# Patient Record
Sex: Male | Born: 1975
Health system: Southern US, Community
[De-identification: ages and names within clinical notes are randomized; demographics above are authoritative.]

## PROBLEM LIST (undated history)

## (undated) DIAGNOSIS — G4730 Sleep apnea, unspecified: Secondary | ICD-10-CM

## (undated) DIAGNOSIS — F909 Attention-deficit hyperactivity disorder, unspecified type: Secondary | ICD-10-CM

## (undated) HISTORY — PX: WISDOM TOOTH EXTRACTION: SHX21

## (undated) HISTORY — DX: Sleep apnea, unspecified: G47.30

---

## 2013-05-04 ENCOUNTER — Other Ambulatory Visit: Payer: Self-pay | Admitting: Internal Medicine

## 2013-05-04 DIAGNOSIS — N5089 Other specified disorders of the male genital organs: Secondary | ICD-10-CM

## 2013-05-05 ENCOUNTER — Ambulatory Visit
Admission: RE | Admit: 2013-05-05 | Discharge: 2013-05-05 | Disposition: A | Discharge status: Home / Self Care | Payer: BC Managed Care – PPO | Source: Ambulatory Visit | Attending: Internal Medicine | Admitting: Internal Medicine

## 2013-05-05 DIAGNOSIS — N5089 Other specified disorders of the male genital organs: Secondary | ICD-10-CM

## 2016-03-18 DIAGNOSIS — M25461 Effusion, right knee: Secondary | ICD-10-CM | POA: Diagnosis not present

## 2016-03-18 DIAGNOSIS — M25561 Pain in right knee: Secondary | ICD-10-CM | POA: Diagnosis not present

## 2016-03-19 ENCOUNTER — Other Ambulatory Visit: Payer: Self-pay | Admitting: Orthopaedic Surgery

## 2016-03-19 DIAGNOSIS — M25561 Pain in right knee: Secondary | ICD-10-CM

## 2016-03-22 ENCOUNTER — Ambulatory Visit
Admission: RE | Admit: 2016-03-22 | Discharge: 2016-03-22 | Disposition: A | Discharge status: Home / Self Care | Payer: Self-pay | Source: Ambulatory Visit | Attending: Orthopaedic Surgery | Admitting: Orthopaedic Surgery

## 2016-03-22 DIAGNOSIS — M25561 Pain in right knee: Secondary | ICD-10-CM

## 2016-03-22 DIAGNOSIS — S83411A Sprain of medial collateral ligament of right knee, initial encounter: Secondary | ICD-10-CM | POA: Diagnosis not present

## 2016-03-22 DIAGNOSIS — S83511A Sprain of anterior cruciate ligament of right knee, initial encounter: Secondary | ICD-10-CM | POA: Diagnosis not present

## 2016-03-23 DIAGNOSIS — M25561 Pain in right knee: Secondary | ICD-10-CM | POA: Diagnosis not present

## 2016-03-23 DIAGNOSIS — M25461 Effusion, right knee: Secondary | ICD-10-CM | POA: Diagnosis not present

## 2016-03-23 DIAGNOSIS — S83511A Sprain of anterior cruciate ligament of right knee, initial encounter: Secondary | ICD-10-CM | POA: Diagnosis not present

## 2016-04-20 DIAGNOSIS — S83511D Sprain of anterior cruciate ligament of right knee, subsequent encounter: Secondary | ICD-10-CM | POA: Diagnosis not present

## 2016-04-24 DIAGNOSIS — M25561 Pain in right knee: Secondary | ICD-10-CM | POA: Diagnosis not present

## 2016-04-24 DIAGNOSIS — R531 Weakness: Secondary | ICD-10-CM | POA: Diagnosis not present

## 2016-04-24 DIAGNOSIS — S83511D Sprain of anterior cruciate ligament of right knee, subsequent encounter: Secondary | ICD-10-CM | POA: Diagnosis not present

## 2016-04-24 DIAGNOSIS — R262 Difficulty in walking, not elsewhere classified: Secondary | ICD-10-CM | POA: Diagnosis not present

## 2016-04-28 DIAGNOSIS — S83511D Sprain of anterior cruciate ligament of right knee, subsequent encounter: Secondary | ICD-10-CM | POA: Diagnosis not present

## 2016-04-28 DIAGNOSIS — M25561 Pain in right knee: Secondary | ICD-10-CM | POA: Diagnosis not present

## 2016-04-28 DIAGNOSIS — R531 Weakness: Secondary | ICD-10-CM | POA: Diagnosis not present

## 2016-04-28 DIAGNOSIS — R262 Difficulty in walking, not elsewhere classified: Secondary | ICD-10-CM | POA: Diagnosis not present

## 2016-05-06 DIAGNOSIS — R262 Difficulty in walking, not elsewhere classified: Secondary | ICD-10-CM | POA: Diagnosis not present

## 2016-05-06 DIAGNOSIS — R531 Weakness: Secondary | ICD-10-CM | POA: Diagnosis not present

## 2016-05-06 DIAGNOSIS — S83511D Sprain of anterior cruciate ligament of right knee, subsequent encounter: Secondary | ICD-10-CM | POA: Diagnosis not present

## 2016-05-06 DIAGNOSIS — M25561 Pain in right knee: Secondary | ICD-10-CM | POA: Diagnosis not present

## 2016-05-08 DIAGNOSIS — S83511D Sprain of anterior cruciate ligament of right knee, subsequent encounter: Secondary | ICD-10-CM | POA: Diagnosis not present

## 2016-05-08 DIAGNOSIS — R531 Weakness: Secondary | ICD-10-CM | POA: Diagnosis not present

## 2016-05-08 DIAGNOSIS — M25561 Pain in right knee: Secondary | ICD-10-CM | POA: Diagnosis not present

## 2016-05-08 DIAGNOSIS — R262 Difficulty in walking, not elsewhere classified: Secondary | ICD-10-CM | POA: Diagnosis not present

## 2016-05-12 DIAGNOSIS — M23611 Other spontaneous disruption of anterior cruciate ligament of right knee: Secondary | ICD-10-CM | POA: Diagnosis not present

## 2016-05-14 DIAGNOSIS — S83511D Sprain of anterior cruciate ligament of right knee, subsequent encounter: Secondary | ICD-10-CM | POA: Diagnosis not present

## 2016-05-15 DIAGNOSIS — M23611 Other spontaneous disruption of anterior cruciate ligament of right knee: Secondary | ICD-10-CM | POA: Diagnosis not present

## 2016-05-19 DIAGNOSIS — M23611 Other spontaneous disruption of anterior cruciate ligament of right knee: Secondary | ICD-10-CM | POA: Diagnosis not present

## 2016-05-22 DIAGNOSIS — M23611 Other spontaneous disruption of anterior cruciate ligament of right knee: Secondary | ICD-10-CM | POA: Diagnosis not present

## 2016-05-29 DIAGNOSIS — M23611 Other spontaneous disruption of anterior cruciate ligament of right knee: Secondary | ICD-10-CM | POA: Diagnosis not present

## 2016-06-01 DIAGNOSIS — M23611 Other spontaneous disruption of anterior cruciate ligament of right knee: Secondary | ICD-10-CM | POA: Diagnosis not present

## 2016-06-03 ENCOUNTER — Ambulatory Visit (INDEPENDENT_AMBULATORY_CARE_PROVIDER_SITE_OTHER): Payer: BLUE CROSS/BLUE SHIELD | Admitting: Orthopedic Surgery

## 2016-06-03 DIAGNOSIS — S83511D Sprain of anterior cruciate ligament of right knee, subsequent encounter: Secondary | ICD-10-CM | POA: Diagnosis not present

## 2016-06-09 DIAGNOSIS — Z125 Encounter for screening for malignant neoplasm of prostate: Secondary | ICD-10-CM | POA: Diagnosis not present

## 2016-06-09 DIAGNOSIS — Z0000 Encounter for general adult medical examination without abnormal findings: Secondary | ICD-10-CM | POA: Diagnosis not present

## 2016-06-14 DIAGNOSIS — Z23 Encounter for immunization: Secondary | ICD-10-CM | POA: Diagnosis not present

## 2016-06-19 DIAGNOSIS — Z125 Encounter for screening for malignant neoplasm of prostate: Secondary | ICD-10-CM | POA: Diagnosis not present

## 2016-06-19 DIAGNOSIS — Z0000 Encounter for general adult medical examination without abnormal findings: Secondary | ICD-10-CM | POA: Diagnosis not present

## 2016-06-19 DIAGNOSIS — E8881 Metabolic syndrome: Secondary | ICD-10-CM | POA: Diagnosis not present

## 2016-06-19 DIAGNOSIS — E668 Other obesity: Secondary | ICD-10-CM | POA: Diagnosis not present

## 2016-06-19 DIAGNOSIS — R0683 Snoring: Secondary | ICD-10-CM | POA: Diagnosis not present

## 2016-06-19 DIAGNOSIS — Z23 Encounter for immunization: Secondary | ICD-10-CM | POA: Diagnosis not present

## 2016-06-19 DIAGNOSIS — K760 Fatty (change of) liver, not elsewhere classified: Secondary | ICD-10-CM | POA: Diagnosis not present

## 2016-06-19 DIAGNOSIS — R7301 Impaired fasting glucose: Secondary | ICD-10-CM | POA: Diagnosis not present

## 2016-06-19 DIAGNOSIS — Z1389 Encounter for screening for other disorder: Secondary | ICD-10-CM | POA: Diagnosis not present

## 2016-07-14 ENCOUNTER — Encounter (INDEPENDENT_AMBULATORY_CARE_PROVIDER_SITE_OTHER): Payer: Self-pay | Admitting: Radiology

## 2016-07-14 NOTE — Progress Notes (Signed)
Patient had called about the knee sleeve, message was taken, his ph [phone], and I called him LMVM told him to stop by office and get this at any time.  Any one of us can size him and get this for him.  Also advised to call me if needed.

## 2016-08-10 DIAGNOSIS — S83511A Sprain of anterior cruciate ligament of right knee, initial encounter: Secondary | ICD-10-CM | POA: Diagnosis not present

## 2016-08-10 DIAGNOSIS — S83411A Sprain of medial collateral ligament of right knee, initial encounter: Secondary | ICD-10-CM | POA: Diagnosis not present

## 2016-09-10 ENCOUNTER — Ambulatory Visit (INDEPENDENT_AMBULATORY_CARE_PROVIDER_SITE_OTHER): Payer: BLUE CROSS/BLUE SHIELD | Admitting: Orthopedic Surgery

## 2017-01-28 DIAGNOSIS — J019 Acute sinusitis, unspecified: Secondary | ICD-10-CM | POA: Diagnosis not present

## 2017-06-12 DIAGNOSIS — Z23 Encounter for immunization: Secondary | ICD-10-CM | POA: Diagnosis not present

## 2017-06-25 DIAGNOSIS — R7301 Impaired fasting glucose: Secondary | ICD-10-CM | POA: Diagnosis not present

## 2017-06-25 DIAGNOSIS — Z0000 Encounter for general adult medical examination without abnormal findings: Secondary | ICD-10-CM | POA: Diagnosis not present

## 2017-06-25 DIAGNOSIS — Z125 Encounter for screening for malignant neoplasm of prostate: Secondary | ICD-10-CM | POA: Diagnosis not present

## 2017-07-06 DIAGNOSIS — Z0000 Encounter for general adult medical examination without abnormal findings: Secondary | ICD-10-CM | POA: Diagnosis not present

## 2017-07-06 DIAGNOSIS — E8881 Metabolic syndrome: Secondary | ICD-10-CM | POA: Diagnosis not present

## 2017-07-06 DIAGNOSIS — K760 Fatty (change of) liver, not elsewhere classified: Secondary | ICD-10-CM | POA: Diagnosis not present

## 2017-07-06 DIAGNOSIS — Z1389 Encounter for screening for other disorder: Secondary | ICD-10-CM | POA: Diagnosis not present

## 2017-07-06 DIAGNOSIS — E668 Other obesity: Secondary | ICD-10-CM | POA: Diagnosis not present

## 2017-07-06 DIAGNOSIS — R7301 Impaired fasting glucose: Secondary | ICD-10-CM | POA: Diagnosis not present

## 2018-05-26 DIAGNOSIS — Z23 Encounter for immunization: Secondary | ICD-10-CM | POA: Diagnosis not present

## 2018-09-27 DIAGNOSIS — R82998 Other abnormal findings in urine: Secondary | ICD-10-CM | POA: Diagnosis not present

## 2018-09-27 DIAGNOSIS — R7301 Impaired fasting glucose: Secondary | ICD-10-CM | POA: Diagnosis not present

## 2018-09-27 DIAGNOSIS — Z0000 Encounter for general adult medical examination without abnormal findings: Secondary | ICD-10-CM | POA: Diagnosis not present

## 2018-09-27 DIAGNOSIS — Z125 Encounter for screening for malignant neoplasm of prostate: Secondary | ICD-10-CM | POA: Diagnosis not present

## 2018-10-05 DIAGNOSIS — E8881 Metabolic syndrome: Secondary | ICD-10-CM | POA: Diagnosis not present

## 2018-10-05 DIAGNOSIS — Z0000 Encounter for general adult medical examination without abnormal findings: Secondary | ICD-10-CM | POA: Diagnosis not present

## 2018-10-05 DIAGNOSIS — M722 Plantar fascial fibromatosis: Secondary | ICD-10-CM | POA: Diagnosis not present

## 2018-10-05 DIAGNOSIS — R0683 Snoring: Secondary | ICD-10-CM | POA: Diagnosis not present

## 2018-10-05 DIAGNOSIS — K760 Fatty (change of) liver, not elsewhere classified: Secondary | ICD-10-CM | POA: Diagnosis not present

## 2018-10-05 DIAGNOSIS — Z1331 Encounter for screening for depression: Secondary | ICD-10-CM | POA: Diagnosis not present

## 2018-10-07 DIAGNOSIS — Z1212 Encounter for screening for malignant neoplasm of rectum: Secondary | ICD-10-CM | POA: Diagnosis not present

## 2018-10-25 ENCOUNTER — Ambulatory Visit (INDEPENDENT_AMBULATORY_CARE_PROVIDER_SITE_OTHER): Payer: BLUE CROSS/BLUE SHIELD | Admitting: Orthopaedic Surgery

## 2018-10-25 ENCOUNTER — Ambulatory Visit (INDEPENDENT_AMBULATORY_CARE_PROVIDER_SITE_OTHER): Payer: BLUE CROSS/BLUE SHIELD

## 2018-10-25 ENCOUNTER — Encounter (INDEPENDENT_AMBULATORY_CARE_PROVIDER_SITE_OTHER): Payer: Self-pay | Admitting: Orthopaedic Surgery

## 2018-10-25 DIAGNOSIS — M25511 Pain in right shoulder: Secondary | ICD-10-CM

## 2018-10-25 DIAGNOSIS — G8929 Other chronic pain: Secondary | ICD-10-CM

## 2018-10-25 NOTE — Progress Notes (Signed)
Musculoskeletal ultrasound: 12 MHz linear probe was used for diagnostic examination.  Long head biceps tendon is located in its groove but has some fluid within the sheath.  The tendon itself is unremarkable.  Subscapularis tendon has possible old mid substance scar but no acute tear.  Supraspinatus tendon is concerning for leading edge tear with retraction.  Infraspinatus is unremarkable.  Patient will proceed with MRI scan as discussed with Dr. Roda Shutters.

## 2018-10-25 NOTE — Progress Notes (Signed)
Office Visit Note   Patient: Jonathan Farley           Date of Birth: 1975-12-13           MRN: [id number] Visit Date: 10/25/2018              Requested by: No referring provider defined for this encounter. PCP: Patient, No Pcp Per   Assessment & Plan: Visit Diagnoses:  1. Acute pain of right shoulder     Plan: Impression is right shoulder injury suspect rotator cuff tear.  We had Dr. Prince Rome evaluate his rotator cuff with an ultrasound and it did show an anterior supraspinatus tear with retraction.  At this point given his age and dominant arm and activity level we recommend an MRI to fully evaluate the rotator cuff tear.  Patient in agreement.  I will call the patient with the results of the MRI.  Follow-Up Instructions: Return if symptoms worsen or fail to improve.   Orders:  Orders Placed This Encounter  Procedures  . XR Shoulder Right   No orders of the defined types were placed in this encounter.     Procedures: No procedures performed   Clinical Data: No additional findings.   Subjective: Chief Complaint  Patient presents with  . Right Shoulder - Pain    Jonathan Farley is a 42 year old right-hand-dominant gentleman who I know well from playing tennis together who comes in with 2-week history of right shoulder pain after he slipped and fell on outstretched hand.  He felt like he jammed the shoulder.  The pain got worse throughout the day.  Overall his pain has only slightly improved.  Otherwise he has been taking over-the-counter medicines.  He has significant difficulty with raising his arm above the shoulder level.  He is having to shrug in order to compensate.  He denies any radicular symptoms.   Review of Systems  Constitutional: Negative.   All other systems reviewed and are negative.    Objective: Vital Signs: There were no vitals taken for this visit.  Physical Exam Vitals signs and nursing note reviewed.  Constitutional:      Appearance: He is  well-developed.  Pulmonary:     Effort: Pulmonary effort is normal.  Abdominal:     Palpations: Abdomen is soft.  Skin:    General: Skin is warm.  Neurological:     Mental Status: He is alert and oriented to person, place, and time.  Psychiatric:        Behavior: Behavior normal.        Thought Content: Thought content normal.        Judgment: Judgment normal.     Ortho Exam Right shoulder exam shows full passive range of motion without significant pain.  He has trouble abducting and forward elevation without shrugging his shoulder.  He is not able to actively abduct above the level of the shoulder.  Negative drop arm.  Manual muscle testing shows weakness of supraspinatus and infraspinatus.  Negative apprehension sign. Specialty Comments:  No specialty comments available.  Imaging: Xr Shoulder Right  Result Date: 10/25/2018 No acute or structural abnormalities    PMFS History: There are no active problems to display for this patient.  History reviewed. No pertinent past medical history.  History reviewed. No pertinent family history.  History reviewed. No pertinent surgical history. Social History   Occupational History  . Not on file  Tobacco Use  . Smoking status: Not on file  Substance and Sexual Activity  .  Alcohol use: Not on file  . Drug use: Not on file  . Sexual activity: Not on file

## 2018-10-26 DIAGNOSIS — F900 Attention-deficit hyperactivity disorder, predominantly inattentive type: Secondary | ICD-10-CM | POA: Diagnosis not present

## 2018-10-26 DIAGNOSIS — Z6835 Body mass index (BMI) 35.0-35.9, adult: Secondary | ICD-10-CM | POA: Diagnosis not present

## 2018-10-31 ENCOUNTER — Ambulatory Visit
Admission: RE | Admit: 2018-10-31 | Discharge: 2018-10-31 | Disposition: A | Discharge status: Home / Self Care | Payer: BLUE CROSS/BLUE SHIELD | Source: Ambulatory Visit | Attending: Orthopaedic Surgery | Admitting: Orthopaedic Surgery

## 2018-10-31 DIAGNOSIS — M25511 Pain in right shoulder: Secondary | ICD-10-CM | POA: Diagnosis not present

## 2018-11-03 ENCOUNTER — Encounter (HOSPITAL_BASED_OUTPATIENT_CLINIC_OR_DEPARTMENT_OTHER): Payer: Self-pay | Admitting: *Deleted

## 2018-11-03 ENCOUNTER — Other Ambulatory Visit: Payer: Self-pay

## 2018-11-08 DIAGNOSIS — Z136 Encounter for screening for cardiovascular disorders: Secondary | ICD-10-CM | POA: Diagnosis not present

## 2018-11-09 ENCOUNTER — Ambulatory Visit (HOSPITAL_BASED_OUTPATIENT_CLINIC_OR_DEPARTMENT_OTHER): Payer: BLUE CROSS/BLUE SHIELD | Admitting: Certified Registered"

## 2018-11-09 ENCOUNTER — Encounter: Payer: Self-pay | Admitting: Orthopaedic Surgery

## 2018-11-09 ENCOUNTER — Encounter (HOSPITAL_BASED_OUTPATIENT_CLINIC_OR_DEPARTMENT_OTHER): Payer: Self-pay | Admitting: *Deleted

## 2018-11-09 ENCOUNTER — Ambulatory Visit (HOSPITAL_BASED_OUTPATIENT_CLINIC_OR_DEPARTMENT_OTHER)
Admission: RE | Admit: 2018-11-09 | Discharge: 2018-11-09 | Disposition: A | Discharge status: Home / Self Care | Payer: BLUE CROSS/BLUE SHIELD | Attending: Orthopaedic Surgery | Admitting: Orthopaedic Surgery

## 2018-11-09 ENCOUNTER — Encounter (HOSPITAL_BASED_OUTPATIENT_CLINIC_OR_DEPARTMENT_OTHER)
Admission: RE | Disposition: A | Discharge status: Home / Self Care | Payer: Self-pay | Source: Home / Self Care | Attending: Orthopaedic Surgery

## 2018-11-09 DIAGNOSIS — F909 Attention-deficit hyperactivity disorder, unspecified type: Secondary | ICD-10-CM | POA: Diagnosis not present

## 2018-11-09 DIAGNOSIS — X58XXXA Exposure to other specified factors, initial encounter: Secondary | ICD-10-CM | POA: Insufficient documentation

## 2018-11-09 DIAGNOSIS — M75101 Unspecified rotator cuff tear or rupture of right shoulder, not specified as traumatic: Secondary | ICD-10-CM | POA: Diagnosis not present

## 2018-11-09 DIAGNOSIS — G8918 Other acute postprocedural pain: Secondary | ICD-10-CM | POA: Diagnosis not present

## 2018-11-09 DIAGNOSIS — S46011A Strain of muscle(s) and tendon(s) of the rotator cuff of right shoulder, initial encounter: Secondary | ICD-10-CM | POA: Insufficient documentation

## 2018-11-09 DIAGNOSIS — Z79899 Other long term (current) drug therapy: Secondary | ICD-10-CM | POA: Diagnosis not present

## 2018-11-09 DIAGNOSIS — Z882 Allergy status to sulfonamides status: Secondary | ICD-10-CM | POA: Insufficient documentation

## 2018-11-09 DIAGNOSIS — S46811A Strain of other muscles, fascia and tendons at shoulder and upper arm level, right arm, initial encounter: Secondary | ICD-10-CM | POA: Diagnosis not present

## 2018-11-09 HISTORY — DX: Attention-deficit hyperactivity disorder, unspecified type: F90.9

## 2018-11-09 HISTORY — PX: SHOULDER ARTHROSCOPY WITH ROTATOR CUFF REPAIR AND SUBACROMIAL DECOMPRESSION: SHX5686

## 2018-11-09 SURGERY — SHOULDER ARTHROSCOPY WITH ROTATOR CUFF REPAIR AND SUBACROMIAL DECOMPRESSION
Anesthesia: General | Site: Shoulder | Laterality: Right

## 2018-11-09 MED ORDER — FENTANYL CITRATE (PF) 100 MCG/2ML IJ SOLN
INTRAMUSCULAR | Status: AC
  Filled 2018-11-09: qty 2

## 2018-11-09 MED ORDER — SCOPOLAMINE 1 MG/3DAYS TD PT72: 1.0000 | MEDICATED_PATCH | Freq: Once | TRANSDERMAL | Status: DC | PRN

## 2018-11-09 MED ORDER — PROPOFOL 10 MG/ML IV BOLUS
INTRAVENOUS | Status: DC | PRN
  Administered 2018-11-09: 200 mg via INTRAVENOUS

## 2018-11-09 MED ORDER — PROMETHAZINE HCL 25 MG PO TABS: 25.0000 mg | ORAL_TABLET | Freq: Four times a day (QID) | ORAL | 1 refills | Status: DC | PRN

## 2018-11-09 MED ORDER — OXYCODONE HCL 5 MG/5ML PO SOLN: 5.0000 mg | Freq: Once | ORAL | Status: DC | PRN

## 2018-11-09 MED ORDER — FENTANYL CITRATE (PF) 100 MCG/2ML IJ SOLN
INTRAMUSCULAR | Status: DC | PRN
  Administered 2018-11-09: 50 ug via INTRAVENOUS

## 2018-11-09 MED ORDER — CHLORHEXIDINE GLUCONATE 4 % EX LIQD: 60.0000 mL | Freq: Once | CUTANEOUS | Status: DC

## 2018-11-09 MED ORDER — OXYCODONE-ACETAMINOPHEN 5-325 MG PO TABS: 1.0000 | ORAL_TABLET | Freq: Four times a day (QID) | ORAL | 0 refills | Status: DC | PRN

## 2018-11-09 MED ORDER — BUPIVACAINE HCL (PF) 0.25 % IJ SOLN
INTRAMUSCULAR | Status: AC
  Filled 2018-11-09: qty 30

## 2018-11-09 MED ORDER — ONDANSETRON HCL 4 MG PO TABS: 4.0000 mg | ORAL_TABLET | Freq: Three times a day (TID) | ORAL | 0 refills | Status: DC | PRN

## 2018-11-09 MED ORDER — LACTATED RINGERS IV SOLN: INTRAVENOUS | Status: DC

## 2018-11-09 MED ORDER — PROPOFOL 10 MG/ML IV BOLUS
INTRAVENOUS | Status: AC
  Filled 2018-11-09: qty 20

## 2018-11-09 MED ORDER — FENTANYL CITRATE (PF) 100 MCG/2ML IJ SOLN
50.0000 ug | INTRAMUSCULAR | Status: DC | PRN
  Administered 2018-11-09: 50 ug via INTRAVENOUS

## 2018-11-09 MED ORDER — OXYCODONE HCL 5 MG PO TABS: 5.0000 mg | ORAL_TABLET | Freq: Once | ORAL | Status: DC | PRN

## 2018-11-09 MED ORDER — MIDAZOLAM HCL 2 MG/2ML IJ SOLN
1.0000 mg | INTRAMUSCULAR | Status: DC | PRN
  Administered 2018-11-09: 2 mg via INTRAVENOUS

## 2018-11-09 MED ORDER — CEFAZOLIN SODIUM-DEXTROSE 2-4 GM/100ML-% IV SOLN
2.0000 g | INTRAVENOUS | Status: AC
  Administered 2018-11-09: 2 g via INTRAVENOUS

## 2018-11-09 MED ORDER — ONDANSETRON HCL 4 MG/2ML IJ SOLN: 4.0000 mg | Freq: Once | INTRAMUSCULAR | Status: DC | PRN

## 2018-11-09 MED ORDER — CEFAZOLIN SODIUM-DEXTROSE 2-4 GM/100ML-% IV SOLN
INTRAVENOUS | Status: AC
  Filled 2018-11-09: qty 100

## 2018-11-09 MED ORDER — SUGAMMADEX SODIUM 200 MG/2ML IV SOLN
INTRAVENOUS | Status: AC
  Filled 2018-11-09: qty 2

## 2018-11-09 MED ORDER — LIDOCAINE 2% (20 MG/ML) 5 ML SYRINGE
INTRAMUSCULAR | Status: DC | PRN
  Administered 2018-11-09: 60 mg via INTRAVENOUS

## 2018-11-09 MED ORDER — SENNOSIDES-DOCUSATE SODIUM 8.6-50 MG PO TABS: 1.0000 | ORAL_TABLET | Freq: Every evening | ORAL | 1 refills | Status: DC | PRN

## 2018-11-09 MED ORDER — MIDAZOLAM HCL 2 MG/2ML IJ SOLN
INTRAMUSCULAR | Status: AC
  Filled 2018-11-09: qty 2

## 2018-11-09 MED ORDER — LACTATED RINGERS IV SOLN
INTRAVENOUS | Status: DC
  Administered 2018-11-09 (×2): via INTRAVENOUS

## 2018-11-09 MED ORDER — ROCURONIUM BROMIDE 100 MG/10ML IV SOLN
INTRAVENOUS | Status: DC | PRN
  Administered 2018-11-09: 50 mg via INTRAVENOUS

## 2018-11-09 MED ORDER — ONDANSETRON HCL 4 MG/2ML IJ SOLN
INTRAMUSCULAR | Status: DC | PRN
  Administered 2018-11-09: 4 mg via INTRAVENOUS

## 2018-11-09 MED ORDER — ONDANSETRON HCL 4 MG/2ML IJ SOLN
INTRAMUSCULAR | Status: AC
  Filled 2018-11-09: qty 2

## 2018-11-09 MED ORDER — FENTANYL CITRATE (PF) 100 MCG/2ML IJ SOLN: 25.0000 ug | INTRAMUSCULAR | Status: DC | PRN

## 2018-11-09 MED ORDER — ROCURONIUM BROMIDE 50 MG/5ML IV SOSY
PREFILLED_SYRINGE | INTRAVENOUS | Status: AC
  Filled 2018-11-09: qty 5

## 2018-11-09 MED ORDER — LIDOCAINE 2% (20 MG/ML) 5 ML SYRINGE
INTRAMUSCULAR | Status: AC
  Filled 2018-11-09: qty 5

## 2018-11-09 MED ORDER — DEXAMETHASONE SODIUM PHOSPHATE 10 MG/ML IJ SOLN
INTRAMUSCULAR | Status: DC | PRN
  Administered 2018-11-09: 10 mg via INTRAVENOUS

## 2018-11-09 MED ORDER — BUPIVACAINE LIPOSOME 1.3 % IJ SUSP
INTRAMUSCULAR | Status: DC | PRN
  Administered 2018-11-09: 10 mL via PERINEURAL

## 2018-11-09 MED ORDER — MEPERIDINE HCL 25 MG/ML IJ SOLN: 6.2500 mg | INTRAMUSCULAR | Status: DC | PRN

## 2018-11-09 MED ORDER — SUGAMMADEX SODIUM 200 MG/2ML IV SOLN
INTRAVENOUS | Status: DC | PRN
  Administered 2018-11-09: 200 mg via INTRAVENOUS

## 2018-11-09 MED ORDER — BUPIVACAINE-EPINEPHRINE (PF) 0.5% -1:200000 IJ SOLN
INTRAMUSCULAR | Status: DC | PRN
  Administered 2018-11-09: 20 mL via PERINEURAL

## 2018-11-09 MED ORDER — SODIUM CHLORIDE 0.9 % IR SOLN
Status: DC | PRN
  Administered 2018-11-09: 3000 mL

## 2018-11-09 MED ORDER — DEXAMETHASONE SODIUM PHOSPHATE 10 MG/ML IJ SOLN
INTRAMUSCULAR | Status: AC
  Filled 2018-11-09: qty 1

## 2018-11-09 SURGICAL SUPPLY — 71 items
ANCHOR SUT CROSSFT KNTLS 4.75 (Anchor) ×1 IMPLANT
BENZOIN TINCTURE PRP APPL 2/3 (GAUZE/BANDAGES/DRESSINGS) IMPLANT
BLADE 4.2CUDA (BLADE) ×2 IMPLANT
BLADE CUTTER GATOR 3.5 (BLADE) IMPLANT
BLADE GREAT WHITE 4.2 (BLADE) IMPLANT
BLADE SURG 15 STRL LF DISP TIS (BLADE) IMPLANT
BLADE SURG 15 STRL SS (BLADE)
BUR OVAL 4.0 (BURR) ×2 IMPLANT
CANNULA 5.75X71 LONG (CANNULA) ×2 IMPLANT
CANNULA CANNULOC THREADED (CANNULA) ×1 IMPLANT
CANNULA SHOULDER 7CM (CANNULA) ×2 IMPLANT
CANNULA TWIST IN 8.25X7CM (CANNULA) IMPLANT
CANNULA TWIST IN 8.25X9CM (CANNULA) IMPLANT
CLSR STERI-STRIP ANTIMIC 1/2X4 (GAUZE/BANDAGES/DRESSINGS) IMPLANT
COVER WAND RF STERILE (DRAPES) IMPLANT
DECANTER SPIKE VIAL GLASS SM (MISCELLANEOUS) IMPLANT
DRAPE IMP U-DRAPE 54X76 (DRAPES) ×2 IMPLANT
DRAPE INCISE IOBAN 66X45 STRL (DRAPES) ×2 IMPLANT
DRAPE STERI 35X30 U-POUCH (DRAPES) ×2 IMPLANT
DRAPE SURG 17X23 STRL (DRAPES) ×2 IMPLANT
DRAPE U-SHAPE 47X51 STRL (DRAPES) ×2 IMPLANT
DRAPE U-SHAPE 76X120 STRL (DRAPES) ×4 IMPLANT
DRSG PAD ABDOMINAL 8X10 ST (GAUZE/BANDAGES/DRESSINGS) ×4 IMPLANT
DURAPREP 26ML APPLICATOR (WOUND CARE) ×4 IMPLANT
ELECT REM PT RETURN 9FT ADLT (ELECTROSURGICAL)
ELECTRODE REM PT RTRN 9FT ADLT (ELECTROSURGICAL) IMPLANT
GAUZE SPONGE 4X4 12PLY STRL (GAUZE/BANDAGES/DRESSINGS) ×4 IMPLANT
GAUZE XEROFORM 1X8 LF (GAUZE/BANDAGES/DRESSINGS) ×2 IMPLANT
GLOVE BIO SURGEON STRL SZ 6.5 (GLOVE) ×1 IMPLANT
GLOVE BIOGEL PI IND STRL 7.0 (GLOVE) ×1 IMPLANT
GLOVE BIOGEL PI INDICATOR 7.0 (GLOVE) ×3
GLOVE ECLIPSE 7.0 STRL STRAW (GLOVE) ×2 IMPLANT
GLOVE SKINSENSE NS SZ7.5 (GLOVE) ×1
GLOVE SKINSENSE STRL SZ7.5 (GLOVE) ×1 IMPLANT
GLOVE SURG SYN 7.5  E (GLOVE) ×1
GLOVE SURG SYN 7.5 E (GLOVE) ×1 IMPLANT
GLOVE SURG SYN 7.5 PF PI (GLOVE) ×1 IMPLANT
GOWN STRL REIN XL XLG (GOWN DISPOSABLE) ×2 IMPLANT
GOWN STRL REUS W/ TWL LRG LVL3 (GOWN DISPOSABLE) ×1 IMPLANT
GOWN STRL REUS W/ TWL XL LVL3 (GOWN DISPOSABLE) ×1 IMPLANT
GOWN STRL REUS W/TWL LRG LVL3 (GOWN DISPOSABLE) ×1
GOWN STRL REUS W/TWL XL LVL3 (GOWN DISPOSABLE) ×1
IMMOBILIZER SHOULDER FOAM XLGE (SOFTGOODS) IMPLANT
KIT SHOULDER TRACTION (DRAPES) ×2 IMPLANT
MANIFOLD NEPTUNE II (INSTRUMENTS) ×2 IMPLANT
NDL AUTO PASS (NEEDLE) IMPLANT
NDL SCORPION MULTI FIRE (NEEDLE) IMPLANT
NEEDLE AUTO PASS (NEEDLE) ×2 IMPLANT
NEEDLE SCORPION MULTI FIRE (NEEDLE) IMPLANT
PACK ARTHROSCOPY DSU (CUSTOM PROCEDURE TRAY) ×2 IMPLANT
PACK BASIN DAY SURGERY FS (CUSTOM PROCEDURE TRAY) ×2 IMPLANT
PAD ORTHO SHOULDER 7X19 LRG (SOFTGOODS) ×1 IMPLANT
PROBE BIPOLAR ATHRO 135MM 90D (MISCELLANEOUS) ×2 IMPLANT
SHEET MEDIUM DRAPE 40X70 STRL (DRAPES) ×2 IMPLANT
SLEEVE SCD COMPRESS KNEE MED (MISCELLANEOUS) ×2 IMPLANT
SLING ARM FOAM STRAP LRG (SOFTGOODS) IMPLANT
SLING ARM IMMOBILIZER LRG (SOFTGOODS) IMPLANT
SLING ARM IMMOBILIZER MED (SOFTGOODS) IMPLANT
SLING ARM MED ADULT FOAM STRAP (SOFTGOODS) IMPLANT
SLING ARM XL FOAM STRAP (SOFTGOODS) IMPLANT
SUT ETHILON 3 0 PS 1 (SUTURE) ×2 IMPLANT
SUT FIBERWIRE #2 38 T-5 BLUE (SUTURE)
SUT HI-FI 2 STRAND C-2 40 (SUTURE) ×1 IMPLANT
SUT TIGER TAPE 7 IN WHITE (SUTURE) IMPLANT
SUTURE FIBERWR #2 38 T-5 BLUE (SUTURE) IMPLANT
SYR 50ML LL SCALE MARK (SYRINGE) IMPLANT
TAPE FIBER 2MM 7IN #2 BLUE (SUTURE) IMPLANT
TOWEL GREEN STERILE FF (TOWEL DISPOSABLE) ×2 IMPLANT
TUBE CONNECTING 20X1/4 (TUBING) IMPLANT
TUBING ARTHRO INFLOW-ONLY STRL (TUBING) ×2 IMPLANT
WATER STERILE IRR 1000ML POUR (IV SOLUTION) ×2 IMPLANT

## 2018-11-09 NOTE — Progress Notes (Signed)
Assisted Dr. Foster with right, ultrasound guided, interscalene  block. Side rails up, monitors on throughout procedure. See vital signs in flow sheet. Tolerated Procedure well. 

## 2018-11-09 NOTE — Progress Notes (Signed)
Discussed MRI findings with patient and reviewed treatment options both surgical and nonsurgical and associated r/b/a.  Patient agreed to proceed with arthroscopic RCR and debridement as indicated.  Questions answered.

## 2018-11-09 NOTE — Discharge Instructions (Signed)
Post-operative patient instructions  Shoulder Arthroscopy    Ice:  Place intermittent ice or cooler pack over your shoulder, 30 minutes on and 30 minutes off.  Continue this for the first 72 hours after surgery, then save ice for use after therapy sessions or on more active days.    Weight:  You may NOT bear weight on your arm as your symptoms allow.  Motion:  Do not perform shoulder motion until instructed.  Dressing:  Perform 1st dressing change at 2 days postoperative. A moderate amount of blood tinged drainage is to be expected.  So if you bleed through the dressing on the first or second day or if you have fevers, it is fine to change the dressing/check the wounds early and redress wound.  If it bleeds through again, or if the incisions are leaking frank blood, please call the office. May change dressing every 1-2 days thereafter to help watch wounds. Can purchase Tegaderm (or 38M Nexcare) water resistant dressings at local pharmacy / [facility].  Shower:  Light shower is ok after 2 days.  Please take shower, NO bath. Recover with gauze and ace wrap to help keep wounds protected.    Pain medication:  A narcotic pain medication has been prescribed.  Take as directed.  Typically you need narcotic pain medication more regularly during the first 3 to 5 days after surgery.  Decrease your use of the medication as the pain improves.  Narcotics can sometimes cause constipation, even after a few doses.  If you have problems with constipation, you can take an over the counter stool softener or light laxative.  If you have persistent problems, please notify your physicians office.  Physical therapy: Additional activity guidelines to be provided by your physician or physical therapist at follow-up visits.   Driving: Do not recommend driving x 2 weeks post surgical, especially if surgery performed on right side. Should not drive while taking narcotic pain medications. It typically takes at least 2 weeks  to restore sufficient neuromuscular function for normal reaction times for driving safety.   Call [phone] for questions or problems. Evenings you will be forwarded to the hospital operator.  Ask for the orthopaedic physician on call. Please call if you experience:    o Redness, foul smelling, or persistent drainage from the surgical site  o worsening shoulder pain and swelling not responsive to medication  o any calf pain and or swelling of the lower leg  o temperatures greater than 101.5 F o other questions or concerns   Thank you for allowing [place] to be a part of your care.   Post Anesthesia Home Care Instructions  Activity: Get plenty of rest for the remainder of the day. A responsible individual must stay with you for 24 hours following the procedure.  For the next 24 hours, DO NOT: -Drive a car -Advertising copywriter -Drink alcoholic beverages -Take any medication unless instructed by your physician -Make any legal decisions or sign important papers.  Meals: Start with liquid foods such as gelatin or soup. Progress to regular foods as tolerated. Avoid greasy, spicy, heavy foods. If nausea and/or vomiting occur, drink only clear liquids until the nausea and/or vomiting subsides. Call your physician if vomiting continues.  Special Instructions/Symptoms: Your throat may feel dry or sore from the anesthesia or the breathing tube placed in your throat during surgery. If this causes discomfort, gargle with warm salt water. The discomfort should disappear within 24 hours.  If you had a scopolamine  patch placed behind your ear for the management of post- operative nausea and/or vomiting:  1. The medication in the patch is effective for 72 hours, after which it should be removed.  Wrap patch in a tissue and discard in the trash. Wash hands thoroughly with soap and water. 2. You may remove the patch earlier than 72 hours if you experience unpleasant side effects which may include dry  mouth, dizziness or visual disturbances. 3. Avoid touching the patch. Wash your hands with soap and water after contact with the patch.     Regional Anesthesia Blocks  1. Numbness or the inability to move the "blocked" extremity may last from 3-48 hours after placement. The length of time depends on the medication injected and your individual response to the medication. If the numbness is not going away after 48 hours, call your surgeon.  2. The extremity that is blocked will need to be protected until the numbness is gone and the  Strength has returned. Because you cannot feel it, you will need to take extra care to avoid injury. Because it may be weak, you may have difficulty moving it or using it. You may not know what position it is in without looking at it while the block is in effect.  3. For blocks in the legs and feet, returning to weight bearing and walking needs to be done carefully. You will need to wait until the numbness is entirely gone and the strength has returned. You should be able to move your leg and foot normally before you try and bear weight or walk. You will need someone to be with you when you first try to ensure you do not fall and possibly risk injury.  4. Bruising and tenderness at the needle site are common side effects and will resolve in a few days.  5. Persistent numbness or new problems with movement should be communicated to the surgeon or the [facility] [phone] [facility] [phone]).   Information for Discharge Teaching: EXPAREL (bupivacaine liposome injectable suspension)   Your surgeon or anesthesiologist gave you EXPAREL(bupivacaine) to help control your pain after surgery.   EXPAREL is a local anesthetic that provides pain relief by numbing the tissue around the surgical site.  EXPAREL is designed to release pain medication over time and can control pain for up to 72 hours.  Depending on how you respond to  EXPAREL, you may require less pain medication during your recovery.  Possible side effects:  Temporary loss of sensation or ability to move in the area where bupivacaine was injected.  Nausea, vomiting, constipation  Rarely, numbness and tingling in your mouth or lips, lightheadedness, or anxiety may occur.  Call your doctor right away if you think you may be experiencing any of these sensations, or if you have other questions regarding possible side effects.  Follow all other discharge instructions given to you by your surgeon or nurse. Eat a healthy diet and drink plenty of water or other fluids.  If you return to the hospital for any reason within 96 hours following the administration of EXPAREL, it is important for health care providers to know that you have received this anesthetic. A teal colored band has been placed on your arm with the date, time and amount of EXPAREL you have received in order to alert and inform your health care providers. Please leave this armband in place for the full 96 hours following administration, and then you may remove the  band.

## 2018-11-09 NOTE — H&P (Signed)
    PREOPERATIVE H&P  Chief Complaint: right shoulder rotator cuff tear  HPI: Jonathan Farley is a 43 y.o. male who presents for surgical treatment of right shoulder rotator cuff tear.  He denies any changes in medical history.  Past Medical History:  Diagnosis Date  . ADHD    Past Surgical History:  Procedure Laterality Date  . WISDOM TOOTH EXTRACTION     Social History   Socioeconomic History  . Marital status: Married    Spouse name: Not on file  . Number of children: Not on file  . Years of education: Not on file  . Highest education level: Not on file  Occupational History  . Not on file  Social Needs  . Financial resource strain: Not on file  . Food insecurity:    Worry: Not on file    Inability: Not on file  . Transportation needs:    Medical: Not on file    Non-medical: Not on file  Tobacco Use  . Smoking status: Never Smoker  . Smokeless tobacco: Never Used  Substance and Sexual Activity  . Alcohol use: Yes    Frequency: Never    Comment: occas  . Drug use: Never  . Sexual activity: Not on file  Lifestyle  . Physical activity:    Days per week: Not on file    Minutes per session: Not on file  . Stress: Not on file  Relationships  . Social connections:    Talks on phone: Not on file    Gets together: Not on file    Attends religious service: Not on file    Active member of club or organization: Not on file    Attends meetings of clubs or organizations: Not on file    Relationship status: Not on file  Other Topics Concern  . Not on file  Social History Narrative  . Not on file   History reviewed. No pertinent family history. Allergies  Allergen Reactions  . Sulfa Antibiotics    Prior to Admission medications   Medication Sig Start Date End Date Taking? Authorizing Provider  Ascorbic Acid (VITAMIN C) 1000 MG tablet Take 1,000 mg by mouth daily.   Yes [provider]  lisdexamfetamine (VYVANSE) 30 MG capsule Take 30 mg by mouth  daily.   Yes [provider]  Multiple Vitamins-Minerals (ONE-A-DAY MENS HEALTH FORMULA PO) Take by mouth.   Yes [provider]     Positive ROS: All other systems have been reviewed and were otherwise negative with the exception of those mentioned in the HPI and as above.  Physical Exam: General: Alert, no acute distress Cardiovascular: No pedal edema Respiratory: No cyanosis, no use of accessory musculature GI: abdomen soft Skin: No lesions in the area of chief complaint Neurologic: Sensation intact distally Psychiatric: Patient is competent for consent with normal mood and affect Lymphatic: no lymphedema  MUSCULOSKELETAL: exam stable  Assessment: right shoulder rotator cuff tear  Plan: Plan for Procedure(s): RIGHT SHOULDER ARTHROSCOPY WITH EXTENSIVE DEBRIDEMENT, ROTATOR CUFF REPAIR AND SUBACROMIAL DECOMPRESSION  The risks benefits and alternatives were discussed with the patient including but not limited to the risks of nonoperative treatment, versus surgical intervention including infection, bleeding, nerve injury,  blood clots, cardiopulmonary complications, morbidity, mortality, among others, and they were willing to proceed.   Glee Arvin, MD   11/09/2018 7:15 AM

## 2018-11-09 NOTE — Anesthesia Postprocedure Evaluation (Signed)
Anesthesia Post Note  Patient: Jonathan Farley  Procedure(s) Performed: RIGHT SHOULDER ARTHROSCOPY WITH EXTENSIVE DEBRIDEMENT, ROTATOR CUFF REPAIR AND SUBACROMIAL DECOMPRESSION (Right Shoulder)     Patient location during evaluation: PACU Anesthesia Type: General Level of consciousness: awake and alert Pain management: pain level controlled Vital Signs Assessment: post-procedure vital signs reviewed and stable Respiratory status: spontaneous breathing, nonlabored ventilation, respiratory function stable and patient connected to nasal cannula oxygen Cardiovascular status: blood pressure returned to baseline and stable Postop Assessment: no apparent nausea or vomiting Anesthetic complications: no    Last Vitals:  Vitals:   11/09/18 1244 11/09/18 1308  BP: (!) 139/96 120/83  Pulse: 83 88  Resp: 16 16  Temp: 36.4 C 36.6 C  SpO2: 93% 91%    Last Pain:  Vitals:   11/09/18 1308  TempSrc:   PainSc: 0-No pain                 Phillips Grout

## 2018-11-09 NOTE — Transfer of Care (Signed)
Immediate Anesthesia Transfer of Care Note  Patient: Channer Bielawski  Procedure(s) Performed: RIGHT SHOULDER ARTHROSCOPY WITH EXTENSIVE DEBRIDEMENT, ROTATOR CUFF REPAIR AND SUBACROMIAL DECOMPRESSION (Right Shoulder)  Patient Location: PACU  Anesthesia Type:GA combined with regional for post-op pain  Level of Consciousness: sedated and responds to stimulation  Airway & Oxygen Therapy: Patient Spontanous Breathing and Patient connected to face mask oxygen  Post-op Assessment: Report given to RN and Post -op Vital signs reviewed and stable  Post vital signs: Reviewed and stable  Last Vitals:  Vitals Value Taken Time  BP    Temp    Pulse 83 11/09/2018 11:35 AM  Resp 17 11/09/2018 11:35 AM  SpO2 94 % 11/09/2018 11:35 AM  Vitals shown include unvalidated device data.  Last Pain:  Vitals:   11/09/18 0812  TempSrc: Oral  PainSc: 0-No pain         Complications: No apparent anesthesia complications

## 2018-11-09 NOTE — Op Note (Signed)
   Date of Surgery: 11/09/2018  INDICATIONS: The patient is a 43 year old male with right shoulder pain that has failed conservative treatment;  The patient did consent to the procedure after discussion of the risks and benefits.  PREOPERATIVE DIAGNOSIS:  1. Right full thickness traumatic supraspinatus tear  POSTOPERATIVE DIAGNOSIS: Same.  PROCEDURE:  1.  Arthroscopic right shoulder rotator cuff repair of supraspinatus tendon 2.  Arthroscopic right shoulder extensive debridement of rotator cuff, labrum, subacromial bursa, subdeltoid bursa. 3.  Arthroscopic right shoulder acromioplasty and subacromial decompression with CA ligament release  SURGEON: N. Glee Arvin, M.D.  ASSIST: Starlyn Skeans [place], [place]; necessary for the timely completion of procedure and due to complexity of procedure..  ANESTHESIA:  general, regional  IV FLUIDS AND URINE: See anesthesia.  ESTIMATED BLOOD LOSS: minimal mL.  IMPLANTS: ConMed 4.75 mm CrossFit anchor  COMPLICATIONS: None.  DESCRIPTION OF PROCEDURE: The patient was brought to the operating room and placed supine on the operating table.  The patient had been signed prior to the procedure and this was documented. The patient had the anesthesia placed by the anesthesiologist.  A time-out was performed to confirm that this was the correct patient, site, side and location. The patient did receive antibiotics prior to the incision and was re-dosed during the procedure as needed at indicated intervals.  The patient was then moved into the lateral position with the operative extremity suspended in the fishing pole mechanism. The patient had the operative extremity prepped and draped in the standard surgical fashion.    Incisions were made for shoulder arthroscopy portals.  We first performed a diagnostic shoulder arthroscopy.  We able to visualize the full-thickness retracted tear of the supraspinatus from the joint.  Gentle debridement of the biceps and the  labrum was performed using oscillating shaver.  We then repositioned the arthroscope into the subacromial space.  The subdeltoid and subacromial bursa were both excised.  An additional anterolateral portal was created.  Acromioplasty along with a CA ligament release was then performed.  In order to improve visualization I then made an additional posterior lateral portal for the arthroscope.  The full-thickness retracted tear of the supraspinatus tendon was identified.  The appearance was of an acute on chronic supraspinatus tear.  The tendon edges were then debrided and the greater tuberosity bone was prepared using a high-speed bur.  I then used 2 separate FiberWire sutures in a horizontal mattress fashion that created 4 separate strands in order to do a single row repair of the supraspinatus by using a single 4.75 mm bio composite anchor that was punched into the greater tuberosity.  I was able to individually pull the sutures in order to bring the leading edge of the supraspinatus tendon all the way back to the footprint.  Final pictures were taken.  The excess fluid was removed from the shoulder joint.  Incisions were closed with interrupted nylon sutures.  Sterile dressings were applied.  Shoulder immobilizer with a pillow was placed.  Patient tolerated procedure well had no immediate complications.  POSTOPERATIVE PLAN: Discharge home and follow-up in the office in 1 week.  Mayra Reel, MD [facility] [phone] 11:07 AM

## 2018-11-09 NOTE — Anesthesia Procedure Notes (Signed)
Anesthesia Regional Block: Supraclavicular block   Pre-Anesthetic Checklist: ,, timeout performed, Correct Patient, Correct Site, Correct Laterality, Correct Procedure, Correct Position, site marked, Risks and benefits discussed,  Surgical consent,  Pre-op evaluation,  At surgeon's request and post-op pain management  Laterality: Right  Prep: chloraprep       Needles:  Injection technique: Single-shot  Needle Type: Echogenic Stimulator Needle     Needle Length: 9cm  Needle Gauge: 21   Needle insertion depth: 6 cm   Additional Needles:   Procedures:,,,, ultrasound used (permanent image in chart),,,,  Narrative:  Start time: 11/09/2018 9:07 AM End time: 11/09/2018 9:11 AM Injection made incrementally with aspirations every 5 mL.  Performed by: Personally  Anesthesiologist: Mal Amabile, MD  Additional Notes: Timeout performed. Patient sedated. Relevant anatomy ID'd using [place]. Incremental 2-34ml injection of LA with frequent aspiration. Patient tolerated procedure well.        Right Supraclavicular Block

## 2018-11-09 NOTE — Anesthesia Preprocedure Evaluation (Signed)
Anesthesia Evaluation  Patient identified by MRN, date of birth, ID band Patient awake    Reviewed: Allergy & Precautions, NPO status , Patient's Chart, lab work & pertinent test results  Airway Mallampati: II  TM Distance: >3 FB Neck ROM: Full    Dental no notable dental hx. (+) Teeth Intact   Pulmonary neg pulmonary ROS,    Pulmonary exam normal breath sounds clear to auscultation       Cardiovascular negative cardio ROS Normal cardiovascular exam Rhythm:Regular Rate:Normal     Neuro/Psych PSYCHIATRIC DISORDERS ADHDnegative neurological ROS     GI/Hepatic negative GI ROS, Neg liver ROS,   Endo/Other  Obesity  Renal/GU negative Renal ROS  negative genitourinary   Musculoskeletal Impingement syndrome right shoulder Subacromial arthrosis right shoulder Possible right rotator cuff tear   Abdominal (+) + obese,   Peds  Hematology negative hematology ROS (+)   Anesthesia Other Findings   Reproductive/Obstetrics                             Anesthesia Physical Anesthesia Plan  ASA: II  Anesthesia Plan: General   Post-op Pain Management:    Induction: Intravenous  PONV Risk Score and Plan: 2 and Ondansetron, Dexamethasone and Treatment may vary due to age or medical condition  Airway Management Planned: Oral ETT  Additional Equipment:   Intra-op Plan:   Post-operative Plan: Extubation in OR  Informed Consent: I have reviewed the patients History and Physical, chart, labs and discussed the procedure including the risks, benefits and alternatives for the proposed anesthesia with the patient or authorized representative who has indicated his/her understanding and acceptance.     Dental advisory given  Plan Discussed with: CRNA and Surgeon  Anesthesia Plan Comments:         Anesthesia Quick Evaluation

## 2018-11-09 NOTE — Anesthesia Procedure Notes (Signed)
Procedure Name: Intubation Date/Time: 11/09/2018 9:37 AM Performed by: Marny Lowenstein, CRNA Pre-anesthesia Checklist: Patient identified, Emergency Drugs available, Suction available and Patient being monitored Patient Re-evaluated:Patient Re-evaluated prior to induction Oxygen Delivery Method: Circle system utilized Preoxygenation: Pre-oxygenation with 100% oxygen Induction Type: IV induction Ventilation: Mask ventilation without difficulty Laryngoscope Size: Miller and 2 Grade View: Grade I Tube type: Oral Tube size: 8.0 mm Number of attempts: 1 Airway Equipment and Method: Patient positioned with wedge pillow and Stylet Placement Confirmation: ETT inserted through vocal cords under direct vision,  positive ETCO2 and breath sounds checked- equal and bilateral Secured at: 21 cm Tube secured with: Tape Dental Injury: Teeth and Oropharynx as per pre-operative assessment

## 2018-11-11 ENCOUNTER — Encounter (HOSPITAL_BASED_OUTPATIENT_CLINIC_OR_DEPARTMENT_OTHER): Payer: Self-pay | Admitting: Orthopaedic Surgery

## 2018-11-17 ENCOUNTER — Ambulatory Visit (INDEPENDENT_AMBULATORY_CARE_PROVIDER_SITE_OTHER): Payer: BLUE CROSS/BLUE SHIELD | Admitting: Orthopaedic Surgery

## 2018-11-17 ENCOUNTER — Other Ambulatory Visit: Payer: Self-pay

## 2018-11-17 ENCOUNTER — Encounter (INDEPENDENT_AMBULATORY_CARE_PROVIDER_SITE_OTHER): Payer: Self-pay | Admitting: Orthopaedic Surgery

## 2018-11-17 DIAGNOSIS — S46811A Strain of other muscles, fascia and tendons at shoulder and upper arm level, right arm, initial encounter: Secondary | ICD-10-CM

## 2018-11-17 NOTE — Progress Notes (Signed)
Jonathan Farley is 1 week status post right shoulder arthroscopy and rotator cuff repair.  He is doing well and reports minimal pain.  His surgical incisions are healed today there is no signs of infection.  We remove the sutures and placed Band-Aids on them.  He is to wear the sling at all times.  We gave him a referral to outpatient physical therapy for medium rotator cuff tear protocol.  Precautions and restrictions were reviewed today.  Questions encouraged and answered.  Follow-up in 5 weeks for recheck.

## 2018-11-18 ENCOUNTER — Ambulatory Visit (INDEPENDENT_AMBULATORY_CARE_PROVIDER_SITE_OTHER): Payer: BLUE CROSS/BLUE SHIELD | Admitting: Orthopaedic Surgery

## 2018-11-18 DIAGNOSIS — S46011D Strain of muscle(s) and tendon(s) of the rotator cuff of right shoulder, subsequent encounter: Secondary | ICD-10-CM | POA: Diagnosis not present

## 2018-11-18 DIAGNOSIS — M6281 Muscle weakness (generalized): Secondary | ICD-10-CM | POA: Diagnosis not present

## 2018-11-18 DIAGNOSIS — M25511 Pain in right shoulder: Secondary | ICD-10-CM | POA: Diagnosis not present

## 2018-11-21 DIAGNOSIS — M9906 Segmental and somatic dysfunction of lower extremity: Secondary | ICD-10-CM | POA: Diagnosis not present

## 2018-11-21 DIAGNOSIS — M4056 Lordosis, unspecified, lumbar region: Secondary | ICD-10-CM | POA: Diagnosis not present

## 2018-11-21 DIAGNOSIS — M21752 Unequal limb length (acquired), left femur: Secondary | ICD-10-CM | POA: Diagnosis not present

## 2018-11-21 DIAGNOSIS — R293 Abnormal posture: Secondary | ICD-10-CM | POA: Diagnosis not present

## 2018-11-22 DIAGNOSIS — M25511 Pain in right shoulder: Secondary | ICD-10-CM | POA: Diagnosis not present

## 2018-11-22 DIAGNOSIS — M6281 Muscle weakness (generalized): Secondary | ICD-10-CM | POA: Diagnosis not present

## 2018-11-22 DIAGNOSIS — S46011D Strain of muscle(s) and tendon(s) of the rotator cuff of right shoulder, subsequent encounter: Secondary | ICD-10-CM | POA: Diagnosis not present

## 2018-11-24 DIAGNOSIS — M9906 Segmental and somatic dysfunction of lower extremity: Secondary | ICD-10-CM | POA: Diagnosis not present

## 2018-11-24 DIAGNOSIS — M4056 Lordosis, unspecified, lumbar region: Secondary | ICD-10-CM | POA: Diagnosis not present

## 2018-11-24 DIAGNOSIS — M21752 Unequal limb length (acquired), left femur: Secondary | ICD-10-CM | POA: Diagnosis not present

## 2018-11-24 DIAGNOSIS — R293 Abnormal posture: Secondary | ICD-10-CM | POA: Diagnosis not present

## 2018-11-25 DIAGNOSIS — M25511 Pain in right shoulder: Secondary | ICD-10-CM | POA: Diagnosis not present

## 2018-11-25 DIAGNOSIS — M6281 Muscle weakness (generalized): Secondary | ICD-10-CM | POA: Diagnosis not present

## 2018-11-25 DIAGNOSIS — S46011D Strain of muscle(s) and tendon(s) of the rotator cuff of right shoulder, subsequent encounter: Secondary | ICD-10-CM | POA: Diagnosis not present

## 2018-11-28 DIAGNOSIS — M9906 Segmental and somatic dysfunction of lower extremity: Secondary | ICD-10-CM | POA: Diagnosis not present

## 2018-11-28 DIAGNOSIS — M21752 Unequal limb length (acquired), left femur: Secondary | ICD-10-CM | POA: Diagnosis not present

## 2018-11-28 DIAGNOSIS — M4056 Lordosis, unspecified, lumbar region: Secondary | ICD-10-CM | POA: Diagnosis not present

## 2018-11-28 DIAGNOSIS — R293 Abnormal posture: Secondary | ICD-10-CM | POA: Diagnosis not present

## 2018-11-29 DIAGNOSIS — Z1331 Encounter for screening for depression: Secondary | ICD-10-CM | POA: Diagnosis not present

## 2018-11-29 DIAGNOSIS — F900 Attention-deficit hyperactivity disorder, predominantly inattentive type: Secondary | ICD-10-CM | POA: Diagnosis not present

## 2018-12-02 DIAGNOSIS — M6281 Muscle weakness (generalized): Secondary | ICD-10-CM | POA: Diagnosis not present

## 2018-12-02 DIAGNOSIS — S46011D Strain of muscle(s) and tendon(s) of the rotator cuff of right shoulder, subsequent encounter: Secondary | ICD-10-CM | POA: Diagnosis not present

## 2018-12-02 DIAGNOSIS — M25511 Pain in right shoulder: Secondary | ICD-10-CM | POA: Diagnosis not present

## 2018-12-05 DIAGNOSIS — M21752 Unequal limb length (acquired), left femur: Secondary | ICD-10-CM | POA: Diagnosis not present

## 2018-12-05 DIAGNOSIS — M9906 Segmental and somatic dysfunction of lower extremity: Secondary | ICD-10-CM | POA: Diagnosis not present

## 2018-12-05 DIAGNOSIS — R293 Abnormal posture: Secondary | ICD-10-CM | POA: Diagnosis not present

## 2018-12-05 DIAGNOSIS — M4056 Lordosis, unspecified, lumbar region: Secondary | ICD-10-CM | POA: Diagnosis not present

## 2018-12-07 ENCOUNTER — Telehealth: Payer: Self-pay | Admitting: Neurology

## 2018-12-07 DIAGNOSIS — M25511 Pain in right shoulder: Secondary | ICD-10-CM | POA: Diagnosis not present

## 2018-12-07 DIAGNOSIS — S46011D Strain of muscle(s) and tendon(s) of the rotator cuff of right shoulder, subsequent encounter: Secondary | ICD-10-CM | POA: Diagnosis not present

## 2018-12-07 DIAGNOSIS — M6281 Muscle weakness (generalized): Secondary | ICD-10-CM | POA: Diagnosis not present

## 2018-12-07 NOTE — Telephone Encounter (Signed)
Pt has returned call to RN Kristen, he is asking for a call back °

## 2018-12-07 NOTE — Addendum Note (Signed)
Addended by: Geronimo Running A on: 12/07/2018 04:34 PM   Modules accepted: Orders

## 2018-12-07 NOTE — Telephone Encounter (Signed)
I called pt to discuss his appt and update his chart. No answer, left a message asking him to call me back. 

## 2018-12-07 NOTE — Telephone Encounter (Signed)
I called pt. Pt's meds, allergies, and PMH were updated.  Pt has never had a sleep study. Pt does endorse snoring.  Pt's weight is 225 lb and he is 5'8.  Pt was instructed on how to measure his neck circumference.  Pt already has the webex app on his phone.  Epworth Sleepiness Scale 0= would never doze 1= slight chance of dozing 2= moderate chance of dozing 3= high chance of dozing  Sitting and reading: 1 Watching TV: 2 Sitting inactive in a public place (ex. Theater or meeting): 1 As a passenger in a car for an hour without a break: 1 Lying down to rest in the afternoon: 2 Sitting and talking to someone: 0 Sitting quietly after lunch (no alcohol): 1 In a car, while stopped in traffic: 0 Total: 8  FSS: 22

## 2018-12-07 NOTE — Telephone Encounter (Signed)
Due to current COVID 19 pandemic, our office is severely reducing in office visits for at least the next 2 weeks, in order to minimize the risk to our patients and healthcare providers. Our staff will contact you for next steps.  Pt understands that although there may be some limitations with this type of visit, we will take all precautions to reduce any security or privacy concerns.  Pt understands that this will be treated like an in office visit and we will file with pt's insurance, and there may be a patient responsible charge related to this service. Pt's email is [email or web address]. Pt understands that the [employer] webex software must be downloaded and operational on the device pt plans to use for the visit. Pt understands that the nurse will be calling to go over pt's chart.

## 2018-12-09 DIAGNOSIS — M21752 Unequal limb length (acquired), left femur: Secondary | ICD-10-CM | POA: Diagnosis not present

## 2018-12-09 DIAGNOSIS — M9906 Segmental and somatic dysfunction of lower extremity: Secondary | ICD-10-CM | POA: Diagnosis not present

## 2018-12-09 DIAGNOSIS — R293 Abnormal posture: Secondary | ICD-10-CM | POA: Diagnosis not present

## 2018-12-09 DIAGNOSIS — M4056 Lordosis, unspecified, lumbar region: Secondary | ICD-10-CM | POA: Diagnosis not present

## 2018-12-14 DIAGNOSIS — M4056 Lordosis, unspecified, lumbar region: Secondary | ICD-10-CM | POA: Diagnosis not present

## 2018-12-14 DIAGNOSIS — M21752 Unequal limb length (acquired), left femur: Secondary | ICD-10-CM | POA: Diagnosis not present

## 2018-12-14 DIAGNOSIS — M6281 Muscle weakness (generalized): Secondary | ICD-10-CM | POA: Diagnosis not present

## 2018-12-14 DIAGNOSIS — M9906 Segmental and somatic dysfunction of lower extremity: Secondary | ICD-10-CM | POA: Diagnosis not present

## 2018-12-14 DIAGNOSIS — M25511 Pain in right shoulder: Secondary | ICD-10-CM | POA: Diagnosis not present

## 2018-12-14 DIAGNOSIS — S46011D Strain of muscle(s) and tendon(s) of the rotator cuff of right shoulder, subsequent encounter: Secondary | ICD-10-CM | POA: Diagnosis not present

## 2018-12-14 DIAGNOSIS — R293 Abnormal posture: Secondary | ICD-10-CM | POA: Diagnosis not present

## 2018-12-15 ENCOUNTER — Other Ambulatory Visit: Payer: Self-pay

## 2018-12-15 ENCOUNTER — Encounter: Payer: Self-pay | Admitting: Neurology

## 2018-12-15 ENCOUNTER — Ambulatory Visit (INDEPENDENT_AMBULATORY_CARE_PROVIDER_SITE_OTHER): Payer: BLUE CROSS/BLUE SHIELD | Admitting: Neurology

## 2018-12-15 DIAGNOSIS — E669 Obesity, unspecified: Secondary | ICD-10-CM

## 2018-12-15 DIAGNOSIS — G4719 Other hypersomnia: Secondary | ICD-10-CM

## 2018-12-15 DIAGNOSIS — R030 Elevated blood-pressure reading, without diagnosis of hypertension: Secondary | ICD-10-CM

## 2018-12-15 DIAGNOSIS — R0683 Snoring: Secondary | ICD-10-CM | POA: Diagnosis not present

## 2018-12-15 NOTE — Progress Notes (Signed)
Huston Foley, MD, PhD [facility] [address] [address] [place], [place] [address]   Virtual Visit via Video Note on 12/15/2018:  I connected with Jonathan Farley on 12/15/18 at  8:30 AM EDT by a video enabled telemedicine application and verified that I am speaking with the correct person using two identifiers.   I discussed the limitations of evaluation and management by telemedicine and the availability of in person appointments. The patient expressed understanding and agreed to proceed.  History of Present Illness: Jonathan Farley is a 43 year old right-handed gentleman with an underlying medical history of ADD, impaired fasting glucose, history of fatty liver, history of left-sided plantar fasciitis, recent right rotator cuff surgery in March 2020, recent elevated blood pressure readings, and obesity, with whom I am conducting a virtual, video based visit via Webex today, in lieu of a face-to-face visit, for evaluation of his sleep disorder, in particular, concern for underlying obstructive sleep apnea. The patient is unaccompanied today and joins via cell phone from home. He is referred by his primary care physician, Dr. Martha Clan and I reviewed his note from 10/05/2018.  The patient reports snoring and nonrestorative sleep, some daytime sleepiness or tiredness. Epworth sleepiness score is 8 out of 24, fatigue severity score is 22 out of 63. He reports that he has lost weight since his rotator cuff surgery on 11/09/2018. He is currently in physical therapy. He denies recurrent morning headaches or nocturia on a night to night basis. Bedtime variable currently because of working from home. Typically he is in bed between 10 and 11 and rise time is around 6 or 6:30. Things are more erratic at this time. He works for [employer]. He smoked briefly in his college days, has been a nonsmoker since then. He drinks alcohol less than 1 per day on average. He drinks  caffeine in the form of coffee, 2 cups per day in the mornings typically. He lives with his wife and 2 children, ages 2 and nearly 50. He has 2 dogs in the household which do sleep some on his bed at night but typically go into their crates to sleep at night. He has a TV in the bedroom and they do watch prior to falling asleep and turn the TV off before sleeping. He has noticed elevated blood pressure values recently, attributed this to taking medication for his pain for his shoulder surgery before his surgery and he had also started Vyvanse for his ADD. His surgery went well and he is not taking any pain medication at this time, he also about 2 weeks ago stopped taking his Vyvanse because of concern about his blood pressure. He has a history of tooth grinding. He was given mouth bite guard for nighttime use years ago but does not currently use it. He has not had a tonsillectomy, is not aware of any family history of OSA.  His Past Medical History Is Significant For: Past Medical History:  Diagnosis Date   ADHD     His Past Surgical History Is Significant For: Past Surgical History:  Procedure Laterality Date   SHOULDER ARTHROSCOPY WITH ROTATOR CUFF REPAIR AND SUBACROMIAL DECOMPRESSION Right 11/09/2018   Procedure: RIGHT SHOULDER ARTHROSCOPY WITH EXTENSIVE DEBRIDEMENT, ROTATOR CUFF REPAIR AND SUBACROMIAL DECOMPRESSION;  Surgeon: Tarry Kos, MD;  Location: [facility];  Service: Orthopedics;  Laterality: Right;   WISDOM TOOTH EXTRACTION      His Family History Is Significant For: No family history on  file.  His Social History Is Significant For: Social History   Socioeconomic History   Marital status: Married    Spouse name: Not on file   Number of children: Not on file   Years of education: Not on file   Highest education level: Not on file  Occupational History   Not on file  Social Needs   Financial resource strain: Not on file   Food insecurity:     Worry: Not on file    Inability: Not on file   Transportation needs:    Medical: Not on file    Non-medical: Not on file  Tobacco Use   Smoking status: Never Smoker   Smokeless tobacco: Never Used  Substance and Sexual Activity   Alcohol use: Yes    Frequency: Never    Comment: occas   Drug use: Never   Sexual activity: Not on file  Lifestyle   Physical activity:    Days per week: Not on file    Minutes per session: Not on file   Stress: Not on file  Relationships   Social connections:    Talks on phone: Not on file    Gets together: Not on file    Attends religious service: Not on file    Active member of club or organization: Not on file    Attends meetings of clubs or organizations: Not on file    Relationship status: Not on file  Other Topics Concern   Not on file  Social History Narrative   Not on file    His Allergies Are:  Allergies  Allergen Reactions   Sulfa Antibiotics   :   His Current Medications Are:  Outpatient Encounter Medications as of 12/15/2018  Medication Sig   Ascorbic Acid (VITAMIN C) 1000 MG tablet Take 1,000 mg by mouth daily.   lisdexamfetamine (VYVANSE) 30 MG capsule Take 30 mg by mouth daily.   Multiple Vitamins-Minerals (ONE-A-DAY MENS HEALTH FORMULA PO) Take by mouth.   No facility-administered encounter medications on file as of 12/15/2018.   :   Review of Systems:  Out of a complete 14 point review of systems, all are reviewed and negative with the exception of these symptoms as listed below:  Observations/Objective:  His most recent vital signs from my review are from 10/05/2018: Blood pressure 130/70, pulse 78, weight 236 pounds for BMI of 35.88. His most recent weight by self-report at home was 224 pounds. Neck circumference by self-report is somewhere between 17 and 17-1/2 inches.    On examination, he is in no acute distress, pleasant, conversant, face is symmetric without facial masking. Speech is clear without  dysarthria, hypophonia or voice tremor noted. He has good neck mobility, he moves both upper extremities without problems, coordination with upper extremities is grossly intact. He stands without problems, walks without problems . Assessment and Plan: Jonathan Farley is a 43 year old right-handed gentleman with an underlying medical history of ADD, impaired fasting glucose, history of fatty liver, history of left-sided plantar fasciitis, recent right rotator cuff surgery in March 2020, recent elevated blood pressure readings, and obesity, with whom I am conducting a virtual, video based new patient visit via Webex in lieu of a face-to-face visit for evaluation of an underlying organic sleep disorder, in particular, concern for obstructive sleep apnea. The patient's medical history and physical exam (albeit limited with current video-based evaluation) are concerning for a diagnosis of obstructive sleep apnea. I discussed with the patient the diagnosis of OSA, its  prognosis and treatment options. I explained in particular the risks and ramifications of untreated moderate to severe OSA, especially with respect to developing cardiovascular disease down the Road, including congestive heart failure, difficult to treat hypertension, cardiac arrhythmias, or stroke. Even type 2 diabetes has, in part, been linked to untreated OSA. Symptoms of untreated OSA may include daytime sleepiness, memory problems, mood irritability and mood disorder such as depression and anxiety, lack of energy, as well as recurrent headaches, especially morning headaches. We talked about the importance of weight control. We talked about the importance of maintaining good sleep hygiene. I recommended the following at this time: home sleep test.  I explained the sleep test procedure to the patient and also outlined possible treatment options of OSA, including the use of a custom-made dental device (which would require a referral to a  specialist dentist), upper airway surgical options, (such as UPPP, which would involve a referral to an ENT). I also explained the CPAP vs. AutoPAP treatment option to the patient, who indicated that he would be willing to try CPAP if the need arises. I answered all his questions today and the patient was in agreement. I plan to see the patient back after the sleep study is completed and encouraged him to call with any interim questions, concerns, problems or updates.   Huston Foley, MD, PhD    Follow Up Instructions: 1. HST, sleep lab staff will reach out to patient to arrange for either sending the unit to home address or a "drive by pickup" and for tutorial, making sure patient is comfortable with the unit and usage, and return if necessary.  2. Consider AutoPap therapy, if home sleep test positive for obstructive sleep apnea, patient agreeable. 3. We talked about alternative treatment options and current limitations, due to virus pandemic.  4. Follow-up after starting AutoPap therapy, follow-up to be scheduled according to set-up date, typically within 31 to 89 days post treatment start. 5. Pursue healthy lifestyle, good sleep hygiene, healthy weight. 6. Call for any interim questions or concerns.   I discussed the assessment and treatment plan with the patient. The patient was provided an opportunity to ask questions and all were answered. The patient agreed with the plan and demonstrated an understanding of the instructions.   The patient was advised to call back or seek an in-person evaluation if the symptoms worsen or if the condition fails to improve as anticipated.  I provided 30 minutes of non-face-to-face time during this encounter.   Huston Foley, MD

## 2018-12-15 NOTE — Patient Instructions (Signed)
Given verbally, during today's virtual video-based encounter, with verbal feedback received.   

## 2018-12-22 ENCOUNTER — Other Ambulatory Visit: Payer: Self-pay

## 2018-12-22 ENCOUNTER — Ambulatory Visit (INDEPENDENT_AMBULATORY_CARE_PROVIDER_SITE_OTHER): Payer: BLUE CROSS/BLUE SHIELD | Admitting: Orthopaedic Surgery

## 2018-12-22 ENCOUNTER — Encounter (INDEPENDENT_AMBULATORY_CARE_PROVIDER_SITE_OTHER): Payer: Self-pay | Admitting: Orthopaedic Surgery

## 2018-12-22 DIAGNOSIS — M21752 Unequal limb length (acquired), left femur: Secondary | ICD-10-CM | POA: Diagnosis not present

## 2018-12-22 DIAGNOSIS — M25511 Pain in right shoulder: Secondary | ICD-10-CM | POA: Diagnosis not present

## 2018-12-22 DIAGNOSIS — R293 Abnormal posture: Secondary | ICD-10-CM | POA: Diagnosis not present

## 2018-12-22 DIAGNOSIS — M9906 Segmental and somatic dysfunction of lower extremity: Secondary | ICD-10-CM | POA: Diagnosis not present

## 2018-12-22 DIAGNOSIS — S46011D Strain of muscle(s) and tendon(s) of the rotator cuff of right shoulder, subsequent encounter: Secondary | ICD-10-CM | POA: Diagnosis not present

## 2018-12-22 DIAGNOSIS — M4056 Lordosis, unspecified, lumbar region: Secondary | ICD-10-CM | POA: Diagnosis not present

## 2018-12-22 DIAGNOSIS — M6281 Muscle weakness (generalized): Secondary | ICD-10-CM | POA: Diagnosis not present

## 2018-12-22 DIAGNOSIS — S46811A Strain of other muscles, fascia and tendons at shoulder and upper arm level, right arm, initial encounter: Secondary | ICD-10-CM

## 2018-12-22 NOTE — Progress Notes (Signed)
Patient ID: Jonathan Farley, male   DOB: 1976-02-02, 43 y.o.   MRN: [id number]  Jonathan Farley is 6 weeks status post right shoulder arthroscopic rotator cuff repair medium sized.  He is doing well and reports some soreness in his shoulder.  He has been progressing along with physical therapy at home exercises very well.  He has no real complaints.  Surgical scars are fully healed.  Actively he is able to flex, abduct above the level of the shoulder without any shrugging compensation.  There is no real pain with those maneuvers.  From my standpoint he is progressing very well.  He is to continue to progress through the rotator cuff repair protocol.  All questions answered to satisfaction.  Follow-up in 6 weeks.

## 2018-12-29 DIAGNOSIS — M25511 Pain in right shoulder: Secondary | ICD-10-CM | POA: Diagnosis not present

## 2018-12-29 DIAGNOSIS — S46011D Strain of muscle(s) and tendon(s) of the rotator cuff of right shoulder, subsequent encounter: Secondary | ICD-10-CM | POA: Diagnosis not present

## 2018-12-29 DIAGNOSIS — M6281 Muscle weakness (generalized): Secondary | ICD-10-CM | POA: Diagnosis not present

## 2019-01-05 DIAGNOSIS — M6281 Muscle weakness (generalized): Secondary | ICD-10-CM | POA: Diagnosis not present

## 2019-01-05 DIAGNOSIS — M25511 Pain in right shoulder: Secondary | ICD-10-CM | POA: Diagnosis not present

## 2019-01-05 DIAGNOSIS — S46011D Strain of muscle(s) and tendon(s) of the rotator cuff of right shoulder, subsequent encounter: Secondary | ICD-10-CM | POA: Diagnosis not present

## 2019-01-11 DIAGNOSIS — M6281 Muscle weakness (generalized): Secondary | ICD-10-CM | POA: Diagnosis not present

## 2019-01-11 DIAGNOSIS — M25511 Pain in right shoulder: Secondary | ICD-10-CM | POA: Diagnosis not present

## 2019-01-11 DIAGNOSIS — S46011D Strain of muscle(s) and tendon(s) of the rotator cuff of right shoulder, subsequent encounter: Secondary | ICD-10-CM | POA: Diagnosis not present

## 2019-01-16 ENCOUNTER — Ambulatory Visit: Payer: BLUE CROSS/BLUE SHIELD | Admitting: Neurology

## 2019-01-16 ENCOUNTER — Other Ambulatory Visit: Payer: Self-pay

## 2019-01-16 DIAGNOSIS — R0683 Snoring: Secondary | ICD-10-CM

## 2019-01-16 DIAGNOSIS — E669 Obesity, unspecified: Secondary | ICD-10-CM

## 2019-01-16 DIAGNOSIS — G4733 Obstructive sleep apnea (adult) (pediatric): Secondary | ICD-10-CM

## 2019-01-16 DIAGNOSIS — R030 Elevated blood-pressure reading, without diagnosis of hypertension: Secondary | ICD-10-CM

## 2019-01-16 DIAGNOSIS — G4719 Other hypersomnia: Secondary | ICD-10-CM

## 2019-01-18 ENCOUNTER — Telehealth: Payer: Self-pay

## 2019-01-18 DIAGNOSIS — M25511 Pain in right shoulder: Secondary | ICD-10-CM | POA: Diagnosis not present

## 2019-01-18 DIAGNOSIS — M6281 Muscle weakness (generalized): Secondary | ICD-10-CM | POA: Diagnosis not present

## 2019-01-18 DIAGNOSIS — S46011D Strain of muscle(s) and tendon(s) of the rotator cuff of right shoulder, subsequent encounter: Secondary | ICD-10-CM | POA: Diagnosis not present

## 2019-01-18 NOTE — Addendum Note (Signed)
Addended by: Huston Foley on: 01/18/2019 07:56 AM   Modules accepted: Orders

## 2019-01-18 NOTE — Telephone Encounter (Signed)
-----   Message from Huston Foley, MD sent at 01/18/2019  7:56 AM EDT ----- Patient referred by Dr. Clelia Croft, seen by me on 12/15/18 in VV, HST on 01/16/19.    Please call and notify the patient that the recent home sleep test showed obstructive sleep apnea. OSA is overall mild, but near the moderate range, and therefore worth treating to see if he feels better after treatment. To that end I recommend treatment for this in the form of autoPAP, which means, that we don't have to bring him in for a sleep study with CPAP, but will let him start using an autoPAP machine at home, through a DME company (of his choice, or as per insurance requirement). The DME representative will educate him on how to use the machine, how to put the mask on, etc. I have placed an order in the chart. Please send referral, talk to patient, send report to referring MD. We will need a FU in sleep clinic for 10 weeks post-PAP set up, please arrange that with me or one of our NPs. Thanks,   Huston Foley, MD, PhD [facility])

## 2019-01-18 NOTE — Progress Notes (Signed)
Patient referred by Dr. Clelia Croft, seen by me on 12/15/18 in VV, HST on 01/16/19.    Please call and notify the patient that the recent home sleep test showed obstructive sleep apnea. OSA is overall mild, but near the moderate range, and therefore worth treating to see if he feels better after treatment. To that end I recommend treatment for this in the form of autoPAP, which means, that we don't have to bring him in for a sleep study with CPAP, but will let him start using an autoPAP machine at home, through a DME company (of his choice, or as per insurance requirement). The DME representative will educate him on how to use the machine, how to put the mask on, etc. I have placed an order in the chart. Please send referral, talk to patient, send report to referring MD. We will need a FU in sleep clinic for 10 weeks post-PAP set up, please arrange that with me or one of our NPs. Thanks,   Huston Foley, MD, PhD [facility])

## 2019-01-18 NOTE — Telephone Encounter (Signed)
I called pt to discuss his sleep study results. No answer, left a message asking him to call me back. 

## 2019-01-18 NOTE — Procedures (Signed)
Patient Information     First Name: Jonathan Farley Last Name: [name] ID: [id number]  Birth Date: Jan 09, 1976 Age: [age] Gender: Male  Referring Provider: Martha ClanShaw, William, MD BMI: 35.8 (W=236 lb, H=5' 8'')  Neck Circ.:  17 '' Epworth:  8/24   Sleep Study Information    Study Date: Jan 16, 2019 S/H/A Version: 5.1.76.4 / 4.1.1528 / 5476   History: 43 year old man with a history of ADD, impaired fasting glucose, history of fatty liver, history of left-sided plantar fasciitis, recent right rotator cuff surgery in March 2020, recent elevated blood pressure readings, and obesity, who reports snoring and non-restorative sleep.   Summary & Diagnosis:     OSA  Recommendations:     This home sleep test demonstrates mild (near-moderate) obstructive sleep apnea with a total AHI of 14.9/hour and O2 nadir of 87%. Given the patient's medical history and sleep related complaints, treatment with positive airway pressure is recommended. This can be achieved in the form of autoPAP titration/trial at home. A proper titration study with CPAP may be helpful or needed down the road, when considered safe (due to current COVID-19 pandemic). Please note, that untreated obstructive sleep apnea may carry additional perioperative morbidity. Patients with significant obstructive sleep apnea should receive perioperative PAP therapy and the surgeons and particularly the anesthesiologist should be informed of the diagnosis and the severity of the sleep disordered breathing. Patient will be reminded regarding compliance with his PAP machine and to be mindful of cleanliness with the equipment and timely with supply changes (i.e. changing filter, mask, hose, humidifier chamber on an ongoing basis as recommended, and cleaning parts that touch the face and nose daily, etc). The patient should be cautioned not to drive, work at heights, or operate dangerous or heavy equipment when tired or sleepy. Review and reiteration of good sleep hygiene  measures should be pursued with any patient. Other causes of the patient's symptoms, including circadian rhythm disturbances, an underlying mood disorder, medication effect and/or an underlying medical problem cannot be ruled out based on this test. Clinical correlation is recommended. The patient and his referring provider will be notified of the test results. The patient [facility] be [facility], either for a face-to-face or virtual visit, whichever feasible and recommended at the time.  I certify that I have reviewed the raw data recording prior to the issuance of this report in accordance with the standards of the American Academy of Sleep Medicine (AASM).  Huston FoleySaima Salimatou Simone, MD, PhD [facility] Diplomat, ABPN (Neurology and Sleep)             Sleep Summary    Oxygen Saturation Statistics     Start Study Time: End Study Time: Total Recording Time:  10:35:32 PM   6:39:07 AM    8 hrs, 3 min  Total Sleep Time % REM of Sleep Time:  6 hrs, 24 min  32.6    Mean: 94 Minimum: 87 Maximum: 99  Mean of Desaturations Nadirs (%):   91  Oxygen Desaturation %: 4-9 10-20 >20 Total  Events Number Total  34 100.0  0 0.0  0 0.0  34 100.0  Oxygen Saturation: <90 <=88 <85 <80 <70  Duration (minutes): Sleep % 0.7 0.2 0.2 0.1 0.0 0.0 0.0 0.0 0.0 0.0     Respiratory Indices      Total Events REM NREM All Night  pRDI:  142  pAHI:  95 ODI:  34  pAHIc:  0  % CSR: 0.0 33.3 25.6 9.2 0.0 17.0 9.8 3.5 0.0 22.3 14.9 5.3 0.0       Pulse Rate Statistics during Sleep (BPM)      Mean: 52 Minimum: N/A Maximum: 86    Indices are calculated using technically valid sleep time of  6 hrs, 22 min. pRDI/pAHI are calculated using oxi desaturations ? 3%  Body Position Statistics  Position Supine Prone Right Left Non-Supine  Sleep (min) 122.0 42.5 61.0 158.5 262.0  Sleep % 31.8 11.1 15.9 41.3 68.2  pRDI 28.2 26.8 30.9 13.3 19.5   pAHI 21.8 15.5 21.9 6.8 11.7  ODI 9.9 2.8 10.0 0.8 3.2     Snoring Statistics Snoring Level (dB) >40 >50 >60 >70 >80 >Threshold (45)  Sleep (min) 236.1 16.6 1.6 0.5 0.0 32.2  Sleep % 61.5 4.3 0.4 0.1 0.0 8.4    Mean: 42 dB Sleep Stages Chart                       pAHI=14.9                                   Mild              Moderate                    Severe                                                 5              15                    30

## 2019-01-19 NOTE — Telephone Encounter (Signed)
Pt has returned the call to [employer], he was made aware she was unavailable.  Pt states on Tues the best time for her to reach him are between 10:00-10:30 beween 12-1pm and anytime after 4 pm

## 2019-01-19 NOTE — Telephone Encounter (Signed)
I called pt to discuss his results. No answer, left a message asking him to call me back. 

## 2019-01-25 DIAGNOSIS — S46011D Strain of muscle(s) and tendon(s) of the rotator cuff of right shoulder, subsequent encounter: Secondary | ICD-10-CM | POA: Diagnosis not present

## 2019-01-25 DIAGNOSIS — M6281 Muscle weakness (generalized): Secondary | ICD-10-CM | POA: Diagnosis not present

## 2019-01-25 DIAGNOSIS — M25511 Pain in right shoulder: Secondary | ICD-10-CM | POA: Diagnosis not present

## 2019-01-25 NOTE — Telephone Encounter (Signed)
I called pt again to discuss. No answer, left a message asking him to call me back. 

## 2019-01-25 NOTE — Telephone Encounter (Signed)
Pt has called RN Kristen back.  Pt is asking if she can call him back at either before 9 am tomorrow or anytime after 4pm tomorrow for the results to be given.

## 2019-01-26 NOTE — Telephone Encounter (Signed)
I called pt. I advised pt that Dr. Frances Furbish reviewed their sleep study results and found that pt has mild to moderate osa. Dr. Frances Furbish recommends that pt start an auto pap at home. I reviewed PAP compliance expectations with the pt. Pt is agreeable to starting an auto-PAP. I advised pt that an order will be sent to a DME, [facility], and [facility] will call the pt within about one week after they file with the pt's insurance. [facility] will show the pt how to use the machine, fit for masks, and troubleshoot the auto-PAP if needed. A follow up appt was made for insurance purposes with Dr. Frances Furbish on 04/27/19 at 8:30am. Pt verbalized understanding to arrive 15 minutes early and bring their auto-PAP. A letter with all of this information in it will be mailed to the pt as a reminder. I verified with the pt that the address we have on file is correct. Pt verbalized understanding of results. Pt had no questions at this time but was encouraged to call back if questions arise. I have sent the order to [facility] and have received confirmation that they have received the order.

## 2019-02-02 ENCOUNTER — Encounter: Payer: Self-pay | Admitting: Orthopaedic Surgery

## 2019-02-02 ENCOUNTER — Other Ambulatory Visit: Payer: Self-pay

## 2019-02-02 ENCOUNTER — Ambulatory Visit (INDEPENDENT_AMBULATORY_CARE_PROVIDER_SITE_OTHER): Payer: BC Managed Care – PPO | Admitting: Orthopaedic Surgery

## 2019-02-02 DIAGNOSIS — G4733 Obstructive sleep apnea (adult) (pediatric): Secondary | ICD-10-CM | POA: Diagnosis not present

## 2019-02-02 DIAGNOSIS — S46811A Strain of other muscles, fascia and tendons at shoulder and upper arm level, right arm, initial encounter: Secondary | ICD-10-CM

## 2019-02-02 DIAGNOSIS — M6281 Muscle weakness (generalized): Secondary | ICD-10-CM | POA: Diagnosis not present

## 2019-02-02 DIAGNOSIS — S46011D Strain of muscle(s) and tendon(s) of the rotator cuff of right shoulder, subsequent encounter: Secondary | ICD-10-CM | POA: Diagnosis not present

## 2019-02-02 DIAGNOSIS — M25511 Pain in right shoulder: Secondary | ICD-10-CM | POA: Diagnosis not present

## 2019-02-02 NOTE — Progress Notes (Signed)
Patient ID: [name], male   DOB: 09/06/1975, 43 y.o.   MRN: [id number]  Jonathan Farley is nearly 3 months status post right shoulder arthroscopy with rotator cuff repair.  He comes in today for routine follow-up.  He is doing excellent.  He reports no pain whatsoever.  He has progressed extremely well through physical therapy.  He has been progressing with strengthening.  He is also doing home exercise.  He is lost 20 to 30 pounds.  His surgical scars are fully healed.  He has excellent active and passive range of motion.  Rotator cuff testing is strong to manual muscle testing.    From my standpoint he is doing really well and has progressed through PT. I think he can consider stopping outpatient PT at this point and just do home exercises for general strengthening.  I think is fine him for him to try to do some light tennis for up to 30 minutes at a time.  He is to avoid overhead motions.  We will see him back in another 2 months.  He will contact us should he have any issues.  Questions encouraged and answered.

## 2019-02-08 DIAGNOSIS — S46011D Strain of muscle(s) and tendon(s) of the rotator cuff of right shoulder, subsequent encounter: Secondary | ICD-10-CM | POA: Diagnosis not present

## 2019-02-08 DIAGNOSIS — M6281 Muscle weakness (generalized): Secondary | ICD-10-CM | POA: Diagnosis not present

## 2019-02-08 DIAGNOSIS — M25511 Pain in right shoulder: Secondary | ICD-10-CM | POA: Diagnosis not present

## 2019-03-04 DIAGNOSIS — G4733 Obstructive sleep apnea (adult) (pediatric): Secondary | ICD-10-CM | POA: Diagnosis not present

## 2019-04-04 DIAGNOSIS — G4733 Obstructive sleep apnea (adult) (pediatric): Secondary | ICD-10-CM | POA: Diagnosis not present

## 2019-04-11 ENCOUNTER — Ambulatory Visit (INDEPENDENT_AMBULATORY_CARE_PROVIDER_SITE_OTHER): Payer: BC Managed Care – PPO | Admitting: Orthopaedic Surgery

## 2019-04-11 ENCOUNTER — Encounter: Payer: Self-pay | Admitting: Orthopaedic Surgery

## 2019-04-11 ENCOUNTER — Other Ambulatory Visit: Payer: Self-pay

## 2019-04-11 DIAGNOSIS — S46811A Strain of other muscles, fascia and tendons at shoulder and upper arm level, right arm, initial encounter: Secondary | ICD-10-CM

## 2019-04-11 NOTE — Progress Notes (Signed)
    Patient: Jonathan Farley           Date of Birth: August 15, 1976           MRN: [id number] Visit Date: 04/11/2019 PCP: Marton Redwood, MD   Assessment & Plan:  Chief Complaint:  Chief Complaint  Patient presents with  . Right Shoulder - Follow-up   Visit Diagnoses:  1. Traumatic tear of supraspinatus tendon, right, initial encounter     Plan: [name] is 5 months status post right shoulder arthroscopy with supraspinatus repair.  He is doing great.  He has completed physical therapy and just doing home exercises.  He has been playing pickle ball and golf and has had no problems.  He has tried playing some tennis.  Overall he is very happy with his recovery.  He has full range of motion at this point.  He has just a slight amount of weakness with empty can testing but without pain.  At this point he is released to activity as tolerated.  Questions encouraged and answered.  Follow-up as needed.  Follow-Up Instructions: Return if symptoms worsen or fail to improve.   Orders:  No orders of the defined types were placed in this encounter.  No orders of the defined types were placed in this encounter.   Imaging: No results found.  PMFS History: Patient Active Problem List   Diagnosis Date Noted  . Traumatic tear of supraspinatus tendon, right, initial encounter 11/09/2018   Past Medical History:  Diagnosis Date  . ADHD     No family history on file.  Past Surgical History:  Procedure Laterality Date  . SHOULDER ARTHROSCOPY WITH ROTATOR CUFF REPAIR AND SUBACROMIAL DECOMPRESSION Right 11/09/2018   Procedure: RIGHT SHOULDER ARTHROSCOPY WITH EXTENSIVE DEBRIDEMENT, ROTATOR CUFF REPAIR AND SUBACROMIAL DECOMPRESSION;  Surgeon: Leandrew Koyanagi, MD;  Location: [facility];  Service: Orthopedics;  Laterality: Right;  . WISDOM TOOTH EXTRACTION     Social History   Occupational History  . Not on file  Tobacco Use  . Smoking status: Never Smoker  . Smokeless tobacco: Never Used   Substance and Sexual Activity  . Alcohol use: Yes    Frequency: Never    Comment: occas  . Drug use: Never  . Sexual activity: Not on file

## 2019-04-27 ENCOUNTER — Ambulatory Visit (INDEPENDENT_AMBULATORY_CARE_PROVIDER_SITE_OTHER): Payer: BC Managed Care – PPO | Admitting: Neurology

## 2019-04-27 ENCOUNTER — Other Ambulatory Visit: Payer: Self-pay

## 2019-04-27 ENCOUNTER — Encounter: Payer: Self-pay | Admitting: Neurology

## 2019-04-27 VITALS — BP 119/72 | HR 77 | Ht 68.0 in | Wt 209.0 lb

## 2019-04-27 DIAGNOSIS — G4733 Obstructive sleep apnea (adult) (pediatric): Secondary | ICD-10-CM | POA: Diagnosis not present

## 2019-04-27 DIAGNOSIS — Z9989 Dependence on other enabling machines and devices: Secondary | ICD-10-CM

## 2019-04-27 NOTE — Progress Notes (Signed)
Subjective:    Patient ID: Jonathan Farley is a 43 y.o. male.  HPI     Interim history:   Jonathan Farley is a 43 year old right-handed gentleman with an underlying medical history of ADD, impaired fasting glucose, history of fatty liver, history of left-sided plantar fasciitis, recent right rotator cuff surgery in March 2020, recent elevated blood pressure readings, and obesity, who presents for follow-up consultation of his sleep apnea, after interim sleep testing and starting AutoPap therapy.  The patient is unaccompanied today.  I first met him in virtual visit on 12/15/2018 at the request of his primary care physician, at which time he reported snoring and nonrestorative sleep.  He was advised to proceed with sleep study testing.  He had a home sleep test on 01/16/2019 which indicated essentially moderate obstructive sleep apnea with an AHI of 14.9/h, O2 nadir of 87%.  He was advised to proceed with AutoPap therapy at home.   Today, 04/27/2019: I reviewed his AutoPap compliance data from 03/27/2019 through 04/25/2019 which is a total of 30 days, during which time he used his machine 29 days with percent used days greater than 4 hours at 90%, indicating excellent compliance with an average usage of 5 hours and 8 minutes, residual AHI at goal at 0.7/h, 95th percentile of pressure at 8 cm, leak acceptable with the 95th percentile at 12 L/min on a pressure range of 5 cm to 12 cm with EPR.  He reports that he had initial adjustment issues.  He worked through them.  He changed from a nasal mask to a ResMed F 30i fullface style and likes it much better.  He is fully compliant with treatment.  He has not noticed any telltale response from it but does not feel any worse.  Blood pressure numbers are better but he has also lost quite a bit of weight, in the realm of 30 pounds.  He is able to exercise, shoulder has done well.  He had not restarted the Vyvanse after stopping it sometime in April.  He had  noticed blood pressure increases at home and was worried about the Vyvanse causing these issues.  He is currently working from home till January next year.  Kids are in online school. He is motivated to continue with the AutoPap.  The patient's allergies, current medications, family history, past medical history, past social history, past surgical history and problem list were reviewed and updated as appropriate.   Previously:   12/15/2018: I am conducting a virtual, video based visit via Webex today, in lieu of a face-to-face visit, for evaluation of his sleep disorder, in particular, concern for underlying obstructive sleep apnea. The patient is unaccompanied today and joins via cell phone from home. He is referred by his primary care physician, Dr. Marton Redwood and I reviewed his note from 10/05/2018.  The patient reports snoring and nonrestorative sleep, some daytime sleepiness or tiredness. Epworth sleepiness score is 8 out of 24, fatigue severity score is 22 out of 63. He reports that he has lost weight since his rotator cuff surgery on 11/09/2018. He is currently in physical therapy. He denies recurrent morning headaches or nocturia on a night to night basis. Bedtime variable currently because of working from home. Typically he is in bed between 10 and 11 and rise time is around 6 or 6:30. Things are more erratic at this time. He works for [employer]. He smoked briefly in his college days, has been a nonsmoker since then. He drinks alcohol  less than 1 per day on average. He drinks caffeine in the form of coffee, 2 cups per day in the mornings typically. He lives with his wife and 2 children, ages 47 and nearly 86. He has 2 dogs in the household which do sleep some on his bed at night but typically go into their crates to sleep at night. He has a TV in the bedroom and they do watch prior to falling asleep and turn the TV off before sleeping. He has noticed elevated blood pressure values recently, attributed  this to taking medication for his pain for his shoulder surgery before his surgery and he had also started Vyvanse for his ADD. His surgery went well and he is not taking any pain medication at this time, he also about 2 weeks ago stopped taking his Vyvanse because of concern about his blood pressure. He has a history of tooth grinding. He was given mouth bite guard for nighttime use years ago but does not currently use it. He has not had a tonsillectomy, is not aware of any family history of OSA.  His Past Medical History Is Significant For: Past Medical History:  Diagnosis Date  . ADHD     His Past Surgical History Is Significant For: Past Surgical History:  Procedure Laterality Date  . SHOULDER ARTHROSCOPY WITH ROTATOR CUFF REPAIR AND SUBACROMIAL DECOMPRESSION Right 11/09/2018   Procedure: RIGHT SHOULDER ARTHROSCOPY WITH EXTENSIVE DEBRIDEMENT, ROTATOR CUFF REPAIR AND SUBACROMIAL DECOMPRESSION;  Surgeon: Leandrew Koyanagi, MD;  Location: [facility];  Service: Orthopedics;  Laterality: Right;  . WISDOM TOOTH EXTRACTION      His Family History Is Significant For: No family history on file.  His Social History Is Significant For: Social History   Socioeconomic History  . Marital status: Married    Spouse name: Not on file  . Number of children: Not on file  . Years of education: Not on file  . Highest education level: Not on file  Occupational History  . Not on file  Social Needs  . Financial resource strain: Not on file  . Food insecurity    Worry: Not on file    Inability: Not on file  . Transportation needs    Medical: Not on file    Non-medical: Not on file  Tobacco Use  . Smoking status: Never Smoker  . Smokeless tobacco: Never Used  Substance and Sexual Activity  . Alcohol use: Yes    Frequency: Never    Comment: occas  . Drug use: Never  . Sexual activity: Not on file  Lifestyle  . Physical activity    Days per week: Not on file    Minutes per  session: Not on file  . Stress: Not on file  Relationships  . Social Herbalist on phone: Not on file    Gets together: Not on file    Attends religious service: Not on file    Active member of club or organization: Not on file    Attends meetings of clubs or organizations: Not on file    Relationship status: Not on file  Other Topics Concern  . Not on file  Social History Narrative  . Not on file    His Allergies Are:  Allergies  Allergen Reactions  . Sulfa Antibiotics   :   His Current Medications Are:  Outpatient Encounter Medications as of 04/27/2019  Medication Sig  . Ascorbic Acid (VITAMIN C) 1000 MG tablet Take 1,000  mg by mouth daily.  . Multiple Vitamins-Minerals (ONE-A-DAY MENS HEALTH FORMULA PO) Take by mouth.  . [DISCONTINUED] lisdexamfetamine (VYVANSE) 30 MG capsule Take 30 mg by mouth daily.   No facility-administered encounter medications on file as of 04/27/2019.   :  Review of Systems:  Out of a complete 14 point review of systems, all are reviewed and negative with the exception of these symptoms as listed below: Review of Systems  Neurological:       Pt presents today to discuss his auto pap. Pt does not think that he feels much better with the auto pap.   Epworth Sleepiness Scale 0= would never doze 1= slight chance of dozing 2= moderate chance of dozing 3= high chance of dozing  Sitting and reading: 2 Watching TV: 1 Sitting inactive in a public place (ex. Theater or meeting): 1 As a passenger in a car for an hour without a break: 0 Lying down to rest in the afternoon: 2 Sitting and talking to someone: 0 Sitting quietly after lunch (no alcohol): 0 In a car, while stopped in traffic: 0 Total: 6     Objective:  Neurological Exam  Physical Exam Physical Examination:   Vitals:   04/27/19 0833  BP: 119/72  Pulse: 77    General Examination: The patient is a very pleasant 43 y.o. male in no acute distress. He appears  well-developed and well-nourished and well groomed.   HEENT: Normocephalic, atraumatic, pupils are equal, round and reactive to light. Extraocular tracking is preserved. Hearing is grossly intact. Face is symmetric with normal facial animation and normal facial sensation. Speech is clear with no dysarthria noted. There is no hypophonia. There is no lip, neck/head, jaw or voice tremor. Oropharynx exam reveals: moderate mouth dryness, adequate dental hygiene and mild airway crowding. Tongue protrudes centrally and palate elevates symmetrically.  Chest: Clear to auscultation without wheezing, rhonchi or crackles noted.  Heart: S1+S2+0, regular and normal without murmurs, rubs or gallops noted.   Abdomen: Soft, non-tender and non-distended with normal bowel sounds appreciated on auscultation.  Extremities: There is no obvious edema.  Skin: Warm and dry without trophic changes noted. There are no varicose veins.  Musculoskeletal: exam reveals no obvious joint deformities, tenderness or joint swelling or erythema.   Neurologically:  Mental status: The patient is awake, alert and oriented in all 4 spheres. His immediate and remote memory, attention, language skills and fund of knowledge are appropriate. There is no evidence of aphasia, agnosia, apraxia or anomia. Speech is clear with normal prosody and enunciation. Thought process is linear. Mood is normal and affect is normal.  Cranial nerves II - XII are as described above under HEENT exam. In addition: shoulder shrug is normal with equal shoulder height noted. Motor exam: Normal bulk, strength and tone is noted. There is no tremor. Fine motor skills and coordination: intact with normal finger taps, normal hand movements, normal rapid alternating patting, normal foot taps and normal foot agility.  Cerebellar testing: No dysmetria or intention tremor.  Sensory exam: intact to light touch.  Gait, station and balance: He stands easily. No veering to  one side is noted. No leaning to one side is noted. Posture is age-appropriate and stance is narrow based. Gait shows normal stride length and normal pace. No problems turning are noted.   Assessment and Plan:  In summary, Jonathan Farley is a very pleasant 43 y.o. male with an underlying medical history of ADD, impaired fasting glucose, history of fatty liver, history  of left-sided plantar fasciitis, s/p R rotator cuff surgery in March 2020, prior elevated blood pressure readings, and borderline obesity, who presents for FU consultation of his OSA after home sleep testing and starting autoPAP at home. His home sleep test on 01/16/2019 indicated mild to near moderate obstructive sleep apnea.  Of note, he has been fully compliant with AutoPap therapy, he has had some improvements in his blood pressure numbers.  However, he has also not restarted his Vyvanse and has been able to lose quite a bit of weight.  Therefore, these improvements are probably a combination of things.  He is motivated to continue with AutoPap.  He is commended for his treatment adherence.  We mutually agreed to revisit things in about 6 months.I answered all his questions today and he was in agreement.

## 2019-04-27 NOTE — Patient Instructions (Addendum)
Please continue using your autoPAP regularly. While your insurance requires that you use PAP at least 4 hours each night on 70% of the nights, I recommend, that you not skip any nights and use it throughout the night if you can. Getting used to PAP and staying with the treatment long term does take time and patience and discipline. Untreated obstructive sleep apnea when it is moderate to severe can have an adverse impact on cardiovascular health and raise her risk for heart disease, arrhythmias, hypertension, congestive heart failure, stroke and diabetes. Untreated obstructive sleep apnea causes sleep disruption, nonrestorative sleep, and sleep deprivation. This can have an impact on your day to day functioning and cause daytime sleepiness and impairment of cognitive function, memory loss, mood disturbance, and problems focussing. Using PAP regularly can improve these symptoms.  You have lost weight; BP is also better. Keep up the good work. Let's re-evaluate things in 6 months.

## 2019-05-05 DIAGNOSIS — G4733 Obstructive sleep apnea (adult) (pediatric): Secondary | ICD-10-CM | POA: Diagnosis not present

## 2019-10-02 ENCOUNTER — Encounter: Payer: Self-pay | Admitting: Neurology

## 2019-10-11 DIAGNOSIS — E669 Obesity, unspecified: Secondary | ICD-10-CM | POA: Diagnosis not present

## 2019-10-11 DIAGNOSIS — Z125 Encounter for screening for malignant neoplasm of prostate: Secondary | ICD-10-CM | POA: Diagnosis not present

## 2019-10-11 DIAGNOSIS — Z0000 Encounter for general adult medical examination without abnormal findings: Secondary | ICD-10-CM | POA: Diagnosis not present

## 2019-10-11 DIAGNOSIS — R7301 Impaired fasting glucose: Secondary | ICD-10-CM | POA: Diagnosis not present

## 2019-10-11 DIAGNOSIS — K760 Fatty (change of) liver, not elsewhere classified: Secondary | ICD-10-CM | POA: Diagnosis not present

## 2019-10-18 DIAGNOSIS — Z1212 Encounter for screening for malignant neoplasm of rectum: Secondary | ICD-10-CM | POA: Diagnosis not present

## 2019-10-18 DIAGNOSIS — R82998 Other abnormal findings in urine: Secondary | ICD-10-CM | POA: Diagnosis not present

## 2019-10-19 DIAGNOSIS — Z0000 Encounter for general adult medical examination without abnormal findings: Secondary | ICD-10-CM | POA: Diagnosis not present

## 2019-10-19 DIAGNOSIS — Z1331 Encounter for screening for depression: Secondary | ICD-10-CM | POA: Diagnosis not present

## 2019-10-19 DIAGNOSIS — E8881 Metabolic syndrome: Secondary | ICD-10-CM | POA: Diagnosis not present

## 2019-10-19 DIAGNOSIS — K760 Fatty (change of) liver, not elsewhere classified: Secondary | ICD-10-CM | POA: Diagnosis not present

## 2019-10-19 DIAGNOSIS — R7301 Impaired fasting glucose: Secondary | ICD-10-CM | POA: Diagnosis not present

## 2019-10-19 DIAGNOSIS — G4733 Obstructive sleep apnea (adult) (pediatric): Secondary | ICD-10-CM | POA: Diagnosis not present

## 2019-11-02 ENCOUNTER — Ambulatory Visit (INDEPENDENT_AMBULATORY_CARE_PROVIDER_SITE_OTHER): Payer: BC Managed Care – PPO | Admitting: Neurology

## 2019-11-02 ENCOUNTER — Encounter: Payer: Self-pay | Admitting: Neurology

## 2019-11-02 ENCOUNTER — Other Ambulatory Visit: Payer: Self-pay

## 2019-11-02 VITALS — BP 127/83 | HR 71 | Temp 97.7°F | Ht 68.0 in | Wt 210.0 lb

## 2019-11-02 DIAGNOSIS — G4733 Obstructive sleep apnea (adult) (pediatric): Secondary | ICD-10-CM | POA: Diagnosis not present

## 2019-11-02 DIAGNOSIS — Z9989 Dependence on other enabling machines and devices: Secondary | ICD-10-CM

## 2019-11-02 DIAGNOSIS — Z789 Other specified health status: Secondary | ICD-10-CM | POA: Diagnosis not present

## 2019-11-02 DIAGNOSIS — R634 Abnormal weight loss: Secondary | ICD-10-CM | POA: Diagnosis not present

## 2019-11-02 IMAGING — MR MR SHOULDER*R* W/O CM
4 of 5 series · 27 of 40 positions shown · non-contrast
Comparison: None.

CLINICAL DATA: Right shoulder pain mostly anterior with difficulty
raising the right arm without pain or support. Patient fell
backwards while walking dog 3 weeks ago.

EXAM:
MRI OF THE RIGHT SHOULDER WITHOUT CONTRAST
TECHNIQUE: Multiplanar, multisequence MR imaging of the shoulder was performed.
No intravenous contrast was administered.

[Series 5: PD fat-sat · axial · 4.0mm · 0.27mm/px · z∈[-1,+96]mm · 8 of 23 slices shown (1 of 2)]
[im 1/23]
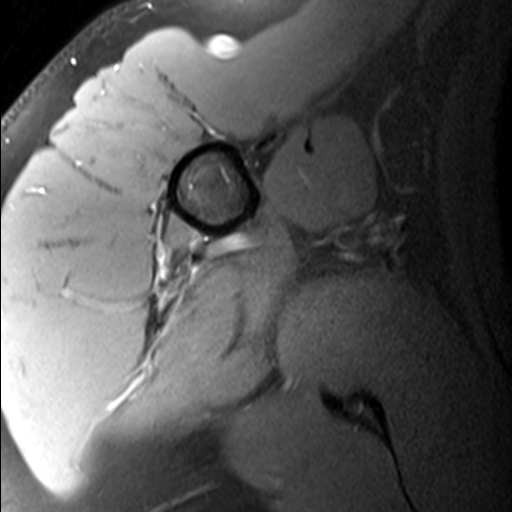
[im 4/23]
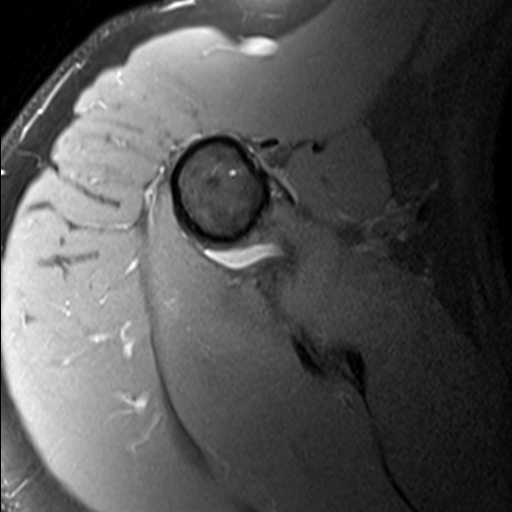
[im 7/23]
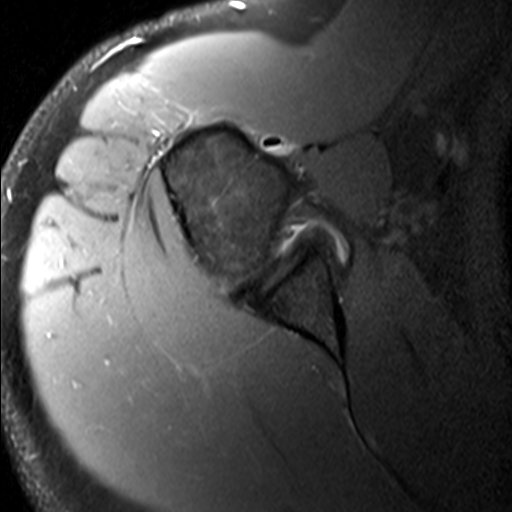
[im 10/23]
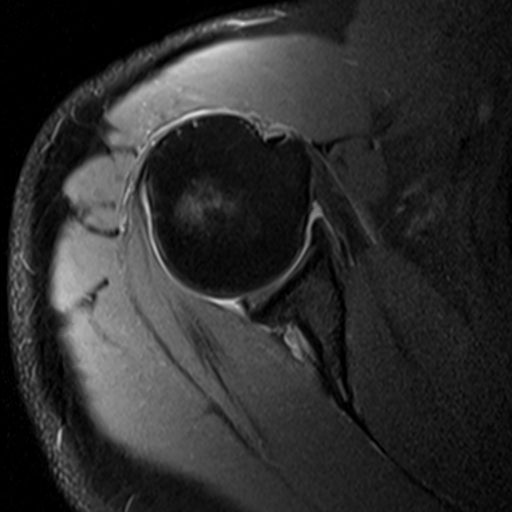
[im 13/23]
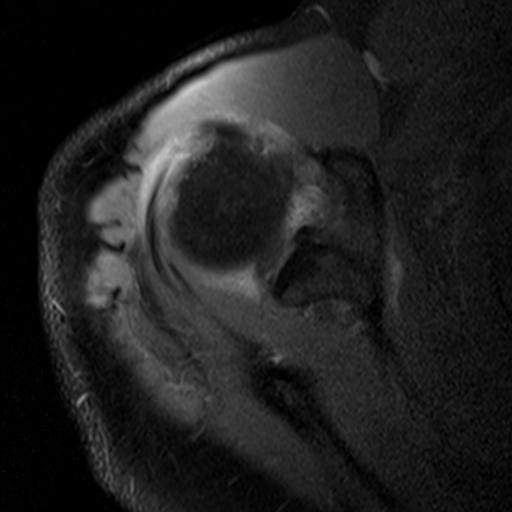
[im 16/23]
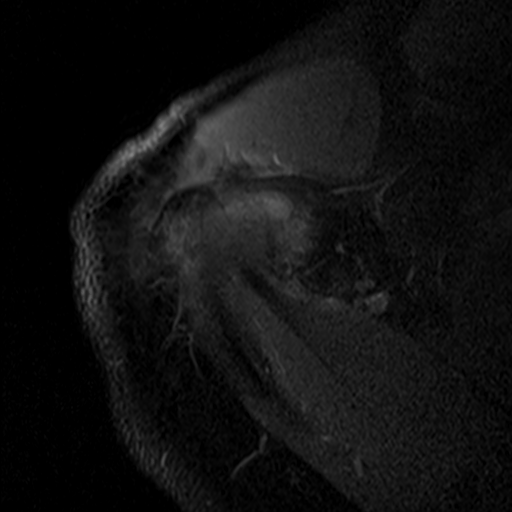
[im 19/23]
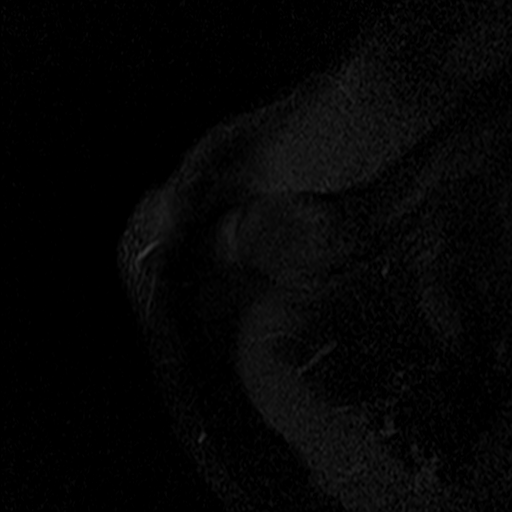
[im 23/23]
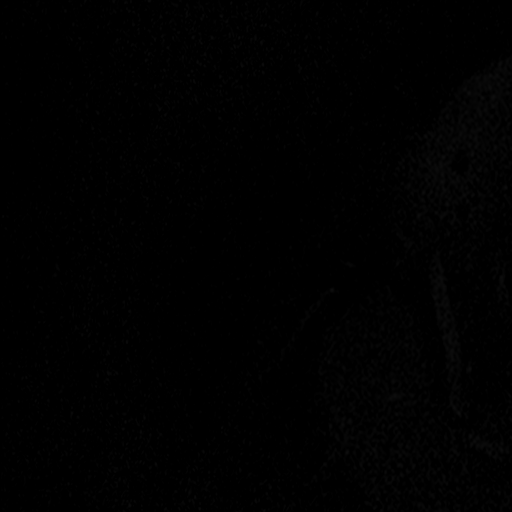

[Series 6: T2 fat-sat · oblique · 4.0mm · 0.55mm/px · 8 of 23 slices shown (1 of 2)]
[im 1/23]
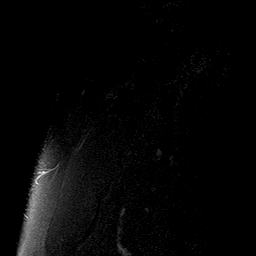
[im 4/23]
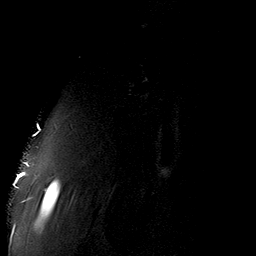
[im 7/23]
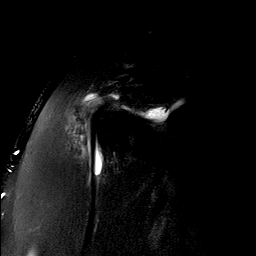
[im 10/23]
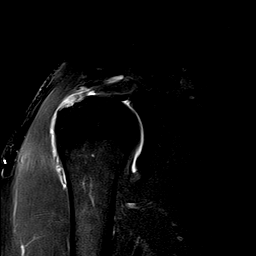
[im 13/23]
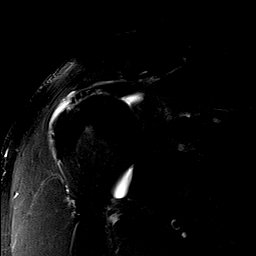
[im 16/23]
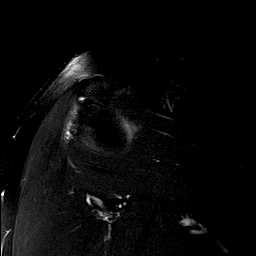
[im 19/23]
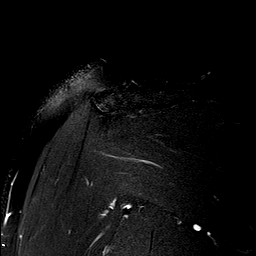
[im 23/23]
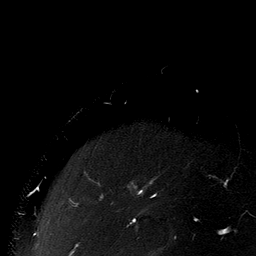

[Series 8: T2 fat-sat · oblique · 4.0mm · 0.55mm/px · 3 of 23 slices shown (2 of 2)]
[im 4/23]
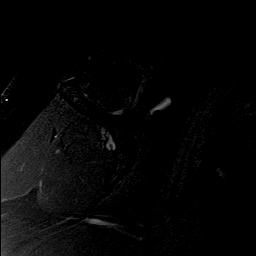
[im 13/23]
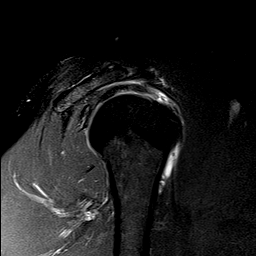
[im 19/23]
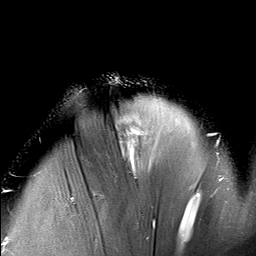

[Series 10: PD fat-sat · oblique · 4.0mm · 0.27mm/px · 8 of 23 slices shown (2 of 2)]
[im 1/23]
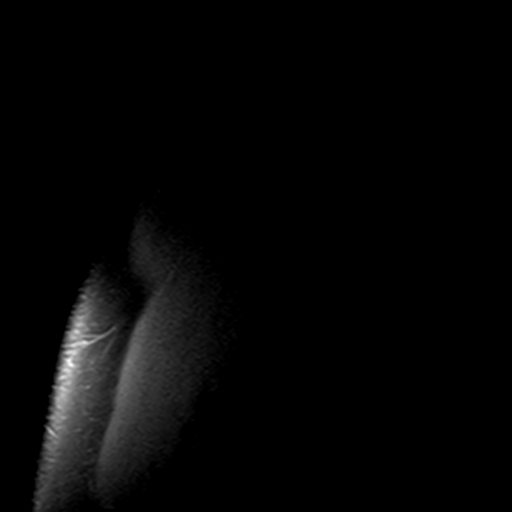
[im 4/23]
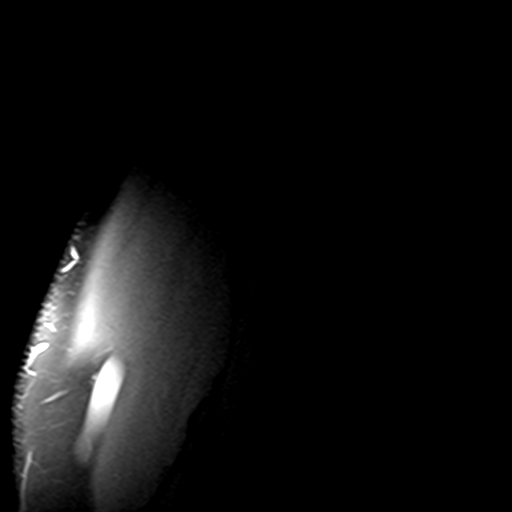
[im 7/23]
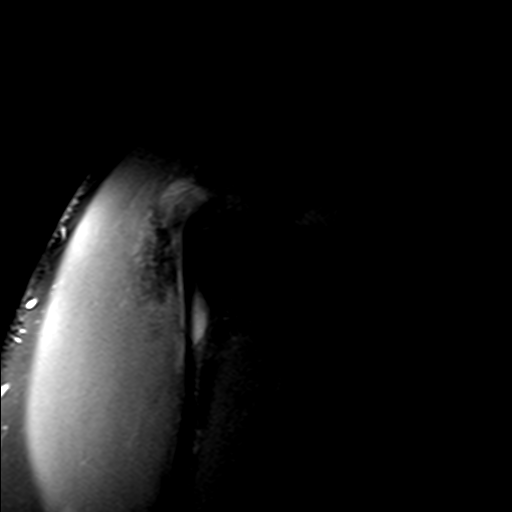
[im 10/23]
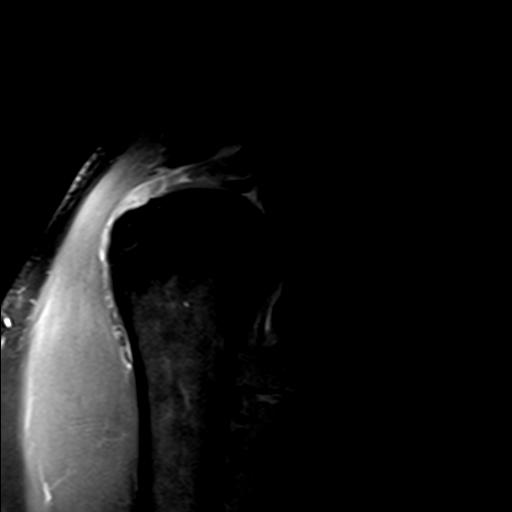
[im 13/23]
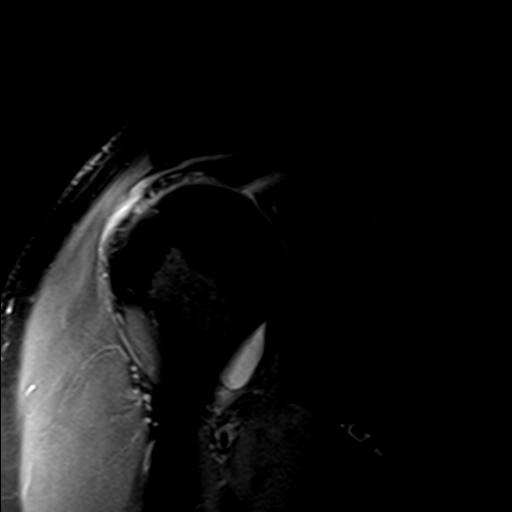
[im 16/23]
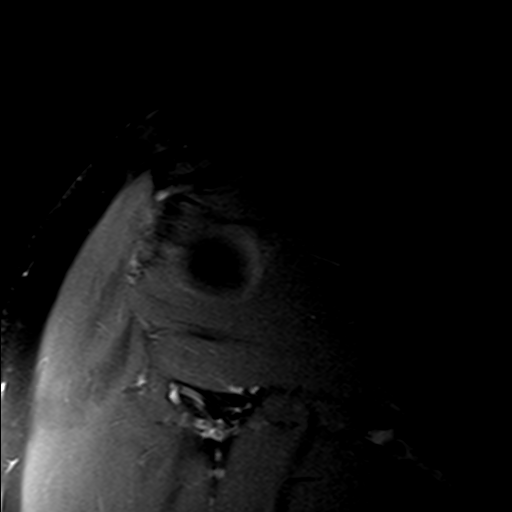
[im 19/23]
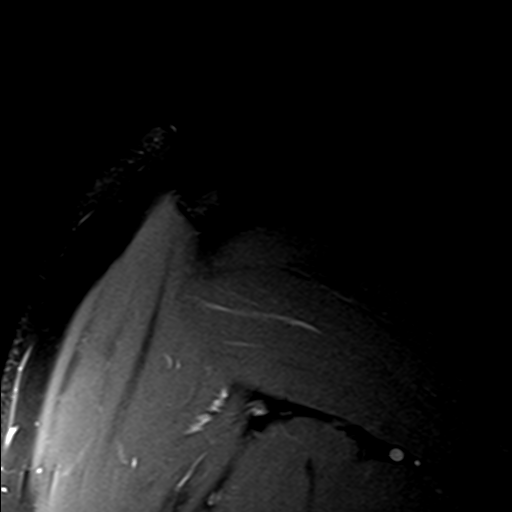
[im 23/23]
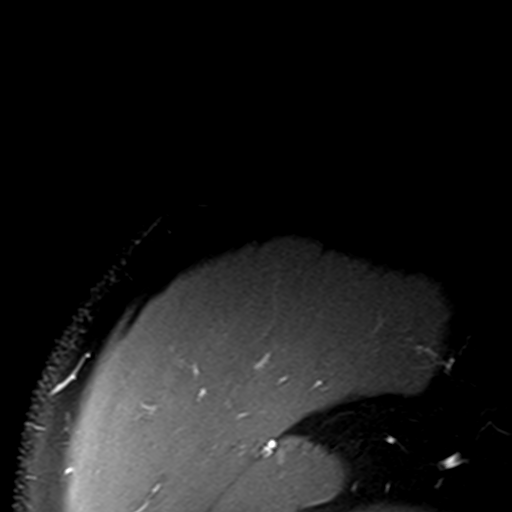

[27 of 40 positions shown; findings below may reference images not displayed]

FINDINGS: Rotator cuff: Full-thickness, partial width tear of the
supraspinatus tendon with retraction to the lateral margin of the
acromion. This involves the anterior half or so of the tendon. The
subscapularis, infraspinatus and teres minor tendons appear intact.

Muscles:  Mild supraspinatus atrophy.

Biceps long head:  Intact

Acromioclavicular Joint: Mild arthropathy of the acromioclavicular
joint. Type I acromion. Subacromial and subdeltoid bursal fluid.

Glenohumeral Joint: Trace joint effusion.  No focal chondral defect.

Labrum:  Intact.

Bones: Subcortical cysts of the superolateral humeral head. No acute
fracture or joint dislocation.

Other: None
IMPRESSION: 1. Full-thickness, partial width tear involving the anterior half of
the supraspinatus tendon with retraction to the lateral margin of
the acromion noted.
2. Associated subacromial-subdeltoid bursal fluid.
3. Intact glenoid labrum and biceps tendon.

## 2019-11-02 NOTE — Patient Instructions (Signed)
You have lost over 25 pounds since we did your home sleep test in May 2020.  Let us recheck your sleep apnea with another home sleep test.  You have been compliant with your AutoPap until the past month.  Please keep up the good work with your weight loss endeavors, your cholesterol numbers and A1c look great.  We will call you with your home sleep test results and think about discontinuing your AutoPap.  You will probably on your AutoPap machine in the next couple of months and could keep it at that point but may not have to use it any longer.  We will let you know what the findings are and talk about options after your home sleep test is done.

## 2019-11-02 NOTE — Progress Notes (Signed)
Subjective:    Patient ID: Jonathan Farley is a 44 y.o. male.  HPI     Interim history:   Mr. Nickalaus Crooke is a 44 year old right-handed gentleman with an underlying medical history of ADD, impaired fasting glucose, history of fatty liver, history of left-sided plantar fasciitis, recent right rotator cuff surgery in March 2020, elevated blood pressure readings, and obesity, who presents for follow-up consultation of his sleep apnea, on AutoPap therapy.  The patient is unaccompanied today.  I last saw him on 04/27/2019, at which time he was compliant with AutoPap therapy and had benefited from treatment.  His blood pressure numbers were better.  He also reported that he had not restarted his Vyvanse after stopping it in April 2020.  He was working on weight loss.  He was advised to continue with his weight loss endeavors and compliance with AutoPap therapy.    Today, 11/02/2019: I reviewed his AutoPap compliance data for the past 90 days, during which time he used his machine 63 days, lately less since about a month ago.  Percent use days greater than 4 hours at 58%, residual AHI at goal at 0.6/h, average pressure for the 95th percentile at 8.2 cm, leak acceptable with a 95th percentile at 8.3 L/min, pressure range of 5 cm to 12 cm with EPR.  In the month of January he was 72% compliant with his AutoPap for more than 4 hours.  He reports that it is uncomfortable and he does not feel any great benefit any longer from it.  He has lost over 25 pounds since we first tested him with a home sleep test in May 2020.  He is motivated to continue with his weight loss endeavors, he exercises nearly daily, tries to hydrate well with water.  He had blood work through his PCP, Dr. Marton Redwood on 10/16/2019 and was able to pull up his results for me to review on his portal on his cell phone: Total cholesterol was 158, LDL 102, A1c was 5.3.   The patient's allergies, current medications, family history, past medical  history, past social history, past surgical history and problem list were reviewed and updated as appropriate.    Previously:  I first met him in virtual visit on 12/15/2018 at the request of his primary care physician, at which time he reported snoring and nonrestorative sleep.  He was advised to proceed with sleep study testing.  He had a home sleep test on 01/16/2019 which indicated essentially moderate obstructive sleep apnea with an AHI of 14.9/h, O2 nadir of 87%.  He was advised to proceed with AutoPap therapy at home.     I reviewed his AutoPap compliance data from 03/27/2019 through 04/25/2019 which is a total of 30 days, during which time he used his machine 29 days with percent used days greater than 4 hours at 90%, indicating excellent compliance with an average usage of 5 hours and 8 minutes, residual AHI at goal at 0.7/h, 95th percentile of pressure at 8 cm, leak acceptable with the 95th percentile at 12 L/min on a pressure range of 5 cm to 12 cm with EPR.     12/15/2018: I am conducting a virtual, video based visit via Webex today, in lieu of a face-to-face visit, for evaluation of his sleep disorder, in particular, concern for underlying obstructive sleep apnea. The patient is unaccompanied today and joins via cell phone from home. He is referred by his primary care physician, Dr. Marton Redwood and I reviewed his  note from 10/05/2018.  The patient reports snoring and nonrestorative sleep, some daytime sleepiness or tiredness. Epworth sleepiness score is 8 out of 24, fatigue severity score is 22 out of 63. He reports that he has lost weight since his rotator cuff surgery on 11/09/2018. He is currently in physical therapy. He denies recurrent morning headaches or nocturia on a night to night basis. Bedtime variable currently because of working from home. Typically he is in bed between 10 and 11 and rise time is around 6 or 6:30. Things are more erratic at this time. He works for [employer]. He smoked  briefly in his college days, has been a nonsmoker since then. He drinks alcohol less than 1 per day on average. He drinks caffeine in the form of coffee, 2 cups per day in the mornings typically. He lives with his wife and 2 children, ages 74 and nearly 29. He has 2 dogs in the household which do sleep some on his bed at night but typically go into their crates to sleep at night. He has a TV in the bedroom and they do watch prior to falling asleep and turn the TV off before sleeping. He has noticed elevated blood pressure values recently, attributed this to taking medication for his pain for his shoulder surgery before his surgery and he had also started Vyvanse for his ADD. His surgery went well and he is not taking any pain medication at this time, he also about 2 weeks ago stopped taking his Vyvanse because of concern about his blood pressure. He has a history of tooth grinding. He was given mouth bite guard for nighttime use years ago but does not currently use it. He has not had a tonsillectomy, is not aware of any family history of OSA.  His Past Medical History Is Significant For: Past Medical History:  Diagnosis Date  . ADHD     His Past Surgical History Is Significant For: Past Surgical History:  Procedure Laterality Date  . SHOULDER ARTHROSCOPY WITH ROTATOR CUFF REPAIR AND SUBACROMIAL DECOMPRESSION Right 11/09/2018   Procedure: RIGHT SHOULDER ARTHROSCOPY WITH EXTENSIVE DEBRIDEMENT, ROTATOR CUFF REPAIR AND SUBACROMIAL DECOMPRESSION;  Surgeon: Leandrew Koyanagi, MD;  Location: [facility];  Service: Orthopedics;  Laterality: Right;  . WISDOM TOOTH EXTRACTION      His Family History Is Significant For: History reviewed. No pertinent family history.  His Social History Is Significant For: Social History   Socioeconomic History  . Marital status: Married    Spouse name: Not on file  . Number of children: Not on file  . Years of education: Not on file  . Highest education  level: Not on file  Occupational History  . Not on file  Tobacco Use  . Smoking status: Never Smoker  . Smokeless tobacco: Never Used  Substance and Sexual Activity  . Alcohol use: Yes    Comment: occas  . Drug use: Never  . Sexual activity: Not on file  Other Topics Concern  . Not on file  Social History Narrative  . Not on file   Social Determinants of Health   Financial Resource Strain:   . Difficulty of Paying Living Expenses: Not on file  Food Insecurity:   . Worried About Charity fundraiser in the Last Year: Not on file  . Ran Out of Food in the Last Year: Not on file  Transportation Needs:   . Lack of Transportation (Medical): Not on file  . Lack of Transportation (Non-Medical):  Not on file  Physical Activity:   . Days of Exercise per Week: Not on file  . Minutes of Exercise per Session: Not on file  Stress:   . Feeling of Stress : Not on file  Social Connections:   . Frequency of Communication with Friends and Family: Not on file  . Frequency of Social Gatherings with Friends and Family: Not on file  . Attends Religious Services: Not on file  . Active Member of Clubs or Organizations: Not on file  . Attends Archivist Meetings: Not on file  . Marital Status: Not on file    His Allergies Are:  Allergies  Allergen Reactions  . Sulfa Antibiotics   :   His Current Medications Are:  Outpatient Encounter Medications as of 11/02/2019  Medication Sig  . Ascorbic Acid (VITAMIN C) 1000 MG tablet Take 1,000 mg by mouth daily.  . Multiple Vitamins-Minerals (ONE-A-DAY MENS HEALTH FORMULA PO) Take by mouth.   No facility-administered encounter medications on file as of 11/02/2019.  :  Review of Systems:  Out of a complete 14 point review of systems, all are reviewed and negative with the exception of these symptoms as listed below: Review of Systems  Neurological:       Here for 6 month f/u on cpap. Reports he does not like the machine and has not been  using it much. He does report about 20 lb weight loss and reports he does not snore as much.     Objective:  Neurological Exam  Physical Exam Physical Examination:   Vitals:   11/02/19 0839  BP: 127/83  Pulse: 71  Temp: 97.7 F (36.5 C)    General Examination: The patient is a very pleasant 44 y.o. male in no acute distress. He appears well-developed and well-nourished and well groomed.   HEENT: Normocephalic, atraumatic, pupils are equal, round and reactive to light, extraocular tracking is preserved. Hearing is grossly intact. Face is symmetric with normal facial animation and normal facial sensation. Speech is clear with no dysarthria noted. There is no hypophonia. There is no lip, neck/head, jaw or voice tremor. Oropharynx exam reveals: moderate mouth dryness, adequate dental hygiene and mild airway crowding. Tongue protrudes centrally and palate elevates symmetrically.  Chest: Clear to auscultation without wheezing, rhonchi or crackles noted.  Heart: S1+S2+0, regular and normal without murmurs, rubs or gallops noted.   Abdomen: Soft, non-tender and non-distended with normal bowel sounds appreciated on auscultation.  Extremities: There is no obvious edema.  Skin: Warm and dry without trophic changes noted. There are no varicose veins.  Musculoskeletal: exam reveals no obvious joint deformities, tenderness or joint swelling or erythema.   Neurologically:  Mental status: The patient is awake, alert and oriented in all 4 spheres. His immediate and remote memory, attention, language skills and fund of knowledge are appropriate. There is no evidence of aphasia, agnosia, apraxia or anomia. Speech is clear with normal prosody and enunciation. Thought process is linear. Mood is normal and affect is normal.  Cranial nerves II - XII are as described above under HEENT exam.  Motor exam: Normal bulk, strength and tone is noted. There is no tremor. Fine motor skills and  coordination: grossly intact.  Cerebellar testing: No dysmetria or intention tremor.  Sensory exam: intact to light touch.  Gait, station and balance: He stands easily. No veering to one side is noted. No leaning to one side is noted. Posture is age-appropriate and stance is narrow based. Gait shows normal  stride length and normal pace. No problems turning are noted.   Assessment and Plan:  In summary, Demont Linford is a very pleasant 44 y.o. male with an underlying medical history of ADD, impaired fasting glucose, history of fatty liver, history of left-sided plantar fasciitis, s/p R rotator cuff surgery in March 2020, prior elevated blood pressure readings, and borderline obesity, who presents for FU consultation of his OSA , On treatment with AutoPap, with somewhat suboptimal compliance lately.  He has been compliant since last year but has also worked on weight loss and has overall achieved nearly 25 pounds of weight loss compared to earlier last year when he had his home sleep test.  He is not sleeping very well with his AutoPap, we mutually agreed to pursue another home sleep test to reevaluate his sleep apnea degree.  If he does not have any significant residual sleep apnea, we can certainly forego AutoPap therapy.  He is probably fairly close to owning his AutoPap machine.  He is encouraged to check with his DME provider regarding this.  We will set up a home sleep test hopefully soon.  We will call him with his test results and take it from there. I answered all his questions today and he was in agreement.

## 2019-12-11 ENCOUNTER — Ambulatory Visit (INDEPENDENT_AMBULATORY_CARE_PROVIDER_SITE_OTHER): Payer: BC Managed Care – PPO | Admitting: Neurology

## 2019-12-11 DIAGNOSIS — R634 Abnormal weight loss: Secondary | ICD-10-CM

## 2019-12-11 DIAGNOSIS — G4733 Obstructive sleep apnea (adult) (pediatric): Secondary | ICD-10-CM | POA: Diagnosis not present

## 2019-12-11 DIAGNOSIS — Z789 Other specified health status: Secondary | ICD-10-CM

## 2019-12-13 NOTE — Procedures (Signed)
Patient Information     First Name: Jonathan Last Name: Farley ID: [id number]  Birth Date: 07-Mar-1976 Age: 44 Gender: Male  Referring Provider: Marton Redwood, MD BMI: 35.8 (W=236 lb, H=5' 8'')  Neck Circ.:  17 '' Epworth:  8/24   Sleep Study Information    Study Date: Dec 11, 2019 S/H/A Version: Jan 04,[id number] / 4.0.1515 / 49  History:    44 year old man with a history of ADD, impaired fasting glucose, history of fatty liver, history of left-sided plantar fasciitis, recent right rotator cuff surgery in March 2020, elevated blood pressure readings, and obesity, who presents for re-evaluation of his OSA. He has been on AutoPap, but feels no significant benefit. He has lost weight.  Summary & Diagnosis:    OSA Recommendations:      This home sleep test demonstrates severe obstructive sleep apnea - by number of events - with a total AHI of 31.7/hour and O2 nadir of 90%. He will be advised to continue with his autoPAP treatment. Alternative treatments may include weight loss along with avoidance of the supine sleep position, or an oral appliance in appropriate candidates.   Please note that untreated obstructive sleep apnea may carry additional perioperative morbidity. Patients with significant obstructive sleep apnea should receive perioperative PAP therapy and the surgeons and particularly the anesthesiologist should be informed of the diagnosis and the severity of the sleep disordered breathing. The patient should be cautioned not to drive, work at heights, or operate dangerous or heavy equipment when tired or sleepy. Review and reiteration of good sleep hygiene measures should be pursued with any patient. Other causes of the patient's symptoms, including circadian rhythm disturbances, an underlying mood disorder, medication effect and/or an underlying medical problem cannot be ruled out based on this test. Clinical correlation is recommended.   The patient and his referring provider will be  notified of the test results. The patient will be seen in follow up in sleep clinic at [facility].  I certify that I have reviewed the raw data recording prior to the issuance of this report in accordance with the standards of the American Academy of Sleep Medicine (AASM).  Star Age, MD, PhD [facility]) Diplomat, ABPN (Neurology and Sleep)            Sleep Summary  Oxygen Saturation Statistics   Start Study Time: End Study Time: Total Recording Time:  9:33:50 PM 6:27:44 AM 8 h, 53 min  Total Sleep Time % REM of Sleep Time:  7 h, 33 min  25.2    Mean: 94 Minimum: 90 Maximum: 98  Mean of Desaturations Nadirs (%):   91  Oxygen Desaturation. %:   4-9 10-20 >20 Total  Events Number Total    59  1 98.3 1.7  0 0.0  60 100.0  Oxygen Saturation: <90 <=88 <85 <80 <70  Duration (minutes): Sleep % 0.0 0.0  0.0 0.0  0.0 0.0 0.0 0.0 0.0 0.0     Respiratory Indices      Total Events REM NREM All Night  pRDI:  281  pAHI:  232 ODI:  60  pAHIc:  3  % CSR: 0.0 54.6 51.2 14.0 0.0 33.4 25.7 6.4 0.5 38.4 31.7 8.2 0.4       Pulse Rate Statistics during Sleep (BPM)      Mean: 55 Minimum: N/A Maximum: 95    Indices are calculated using technically valid sleep time of  7 h, 19 min. Central-Indices are calculated  using technically valid sleep time of  7  hrs, 10 min. pRDI/pAHI are calculated using oxi desaturations ? 3% Sit Body Position Statistics  Position Supine Prone Right Left Non-Supine  Sleep (min) 229.5 157.5 1.0 65.9 224.4  Sleep % 50.6 34.7 0.2 14.5 49.4  pRDI 50.0 29.0 N/A 20.3 26.9  pAHI 42.1 21.3 N/A 20.3 21.4  ODI 11.0 4.3 N/A 7.4 5.4     Snoring Statistics Snoring Level (dB) >40 >50 >60 >70 >80 >Threshold (45)  Sleep (min) 213.5 36.6 5.2 0.4 0.0 61.5  Sleep % 47.0 8.1 1.1 0.1 0.0 13.5    Mean: 42 dB Sleep Stages Chart                                                pAHI=31.7                                                             Mild              Moderate                    Severe                                                 5              15                    30  * Reference values are according to AASM guidelines

## 2019-12-13 NOTE — Progress Notes (Signed)
Patient presented for HST for re-eval of his OSA after weight loss and he reports not feeling much in the way of benefit from his autoPAP.  Please call and notify the patient that the recent home sleep test showed obstructive sleep apnea. OSA is in the severe range, by AHI criteria, total AHI of 31.7/hour and O2 nadir was 90%. I would recommend he continue with autoPAP. He is encouraged to continue to work on his weight loss. Alternative treatment with a dental device can be considered. If he would like to pursue dental treatment, I can place a referral to dentistry, a dentist of his choosing, or we can refer to one of the practices we know participate in OSA treatment. Please let me know.  Please also offer FU appt with NP for OSA FU in about 6 mo.  Huston Foley, MD, PhD [facility])

## 2019-12-14 ENCOUNTER — Telehealth: Payer: Self-pay | Admitting: Neurology

## 2019-12-14 NOTE — Telephone Encounter (Signed)
Called patient to discuss sleep study results. No answer at this time. LVM for the patient to call back.   

## 2019-12-14 NOTE — Telephone Encounter (Signed)
-----   Message from Huston Foley, MD sent at 12/13/2019  7:50 AM EDT ----- Patient presented for HST for re-eval of his OSA after weight loss and he reports not feeling much in the way of benefit from his autoPAP.  Please call and notify the patient that the recent home sleep test showed obstructive sleep apnea. OSA is in the severe range, by AHI criteria, total AHI of 31.7/hour and O2 nadir was 90%. I would recommend he continue with autoPAP. He is encouraged to continue to work on his weight loss. Alternative treatment with a dental device can be considered. If he would like to pursue dental treatment, I can place a referral to dentistry, a dentist of his choosing, or we can refer to one of the practices we know participate in OSA treatment. Please let me know.  Please also offer FU appt with NP for OSA FU in about 6 mo.  Huston Foley, MD, PhD [facility])

## 2019-12-14 NOTE — Telephone Encounter (Signed)
Pt returned call and I was able to review the HST results. Patient was concerned because this study actually indicated his OSA has worsened despite his weight loss. Advised the patient that we were only able to go off of what this nights data showed and with that said she would recommend the patient to continue using his auto CPAP machine. I reviewed alternative options and the patient states he will restart. I was able to schedule the patient in a 6 mth follow up visit with [facility]. Pt will reach out to [employer] in regards to getting new supplies. Pt verbalized understanding. Pt had no questions at this time but was encouraged to call back if questions arise.

## 2019-12-25 DIAGNOSIS — Z20828 Contact with and (suspected) exposure to other viral communicable diseases: Secondary | ICD-10-CM | POA: Diagnosis not present

## 2019-12-25 DIAGNOSIS — Z03818 Encounter for observation for suspected exposure to other biological agents ruled out: Secondary | ICD-10-CM | POA: Diagnosis not present

## 2019-12-26 DIAGNOSIS — Z03818 Encounter for observation for suspected exposure to other biological agents ruled out: Secondary | ICD-10-CM | POA: Diagnosis not present

## 2020-04-30 DIAGNOSIS — G4733 Obstructive sleep apnea (adult) (pediatric): Secondary | ICD-10-CM | POA: Diagnosis not present

## 2020-05-29 DIAGNOSIS — G4733 Obstructive sleep apnea (adult) (pediatric): Secondary | ICD-10-CM | POA: Diagnosis not present

## 2020-06-19 ENCOUNTER — Ambulatory Visit: Payer: Self-pay | Admitting: Adult Health

## 2020-09-07 ENCOUNTER — Other Ambulatory Visit: Payer: BC Managed Care – PPO

## 2020-09-07 DIAGNOSIS — Z20822 Contact with and (suspected) exposure to covid-19: Secondary | ICD-10-CM | POA: Diagnosis not present

## 2020-09-09 ENCOUNTER — Other Ambulatory Visit: Payer: BC Managed Care – PPO

## 2020-09-10 LAB — NOVEL CORONAVIRUS, NAA: SARS-CoV-2, NAA: NOT DETECTED

## 2020-10-09 ENCOUNTER — Ambulatory Visit: Payer: Self-pay | Admitting: Adult Health

## 2020-10-18 DIAGNOSIS — K760 Fatty (change of) liver, not elsewhere classified: Secondary | ICD-10-CM | POA: Diagnosis not present

## 2020-10-18 DIAGNOSIS — Z125 Encounter for screening for malignant neoplasm of prostate: Secondary | ICD-10-CM | POA: Diagnosis not present

## 2020-10-18 DIAGNOSIS — R7301 Impaired fasting glucose: Secondary | ICD-10-CM | POA: Diagnosis not present

## 2020-10-18 DIAGNOSIS — R7989 Other specified abnormal findings of blood chemistry: Secondary | ICD-10-CM | POA: Diagnosis not present

## 2020-10-25 DIAGNOSIS — Z0000 Encounter for general adult medical examination without abnormal findings: Secondary | ICD-10-CM | POA: Diagnosis not present

## 2020-10-25 DIAGNOSIS — R7301 Impaired fasting glucose: Secondary | ICD-10-CM | POA: Diagnosis not present

## 2020-10-25 DIAGNOSIS — Z1331 Encounter for screening for depression: Secondary | ICD-10-CM | POA: Diagnosis not present

## 2020-10-25 DIAGNOSIS — Z1339 Encounter for screening examination for other mental health and behavioral disorders: Secondary | ICD-10-CM | POA: Diagnosis not present

## 2020-11-18 ENCOUNTER — Ambulatory Visit: Payer: Self-pay | Admitting: Adult Health

## 2020-12-23 ENCOUNTER — Encounter: Payer: Self-pay | Admitting: Gastroenterology

## 2021-01-09 ENCOUNTER — Ambulatory Visit (AMBULATORY_SURGERY_CENTER): Payer: Self-pay

## 2021-01-09 ENCOUNTER — Other Ambulatory Visit: Payer: Self-pay

## 2021-01-09 VITALS — Ht 68.0 in | Wt 227.0 lb

## 2021-01-09 DIAGNOSIS — Z1211 Encounter for screening for malignant neoplasm of colon: Secondary | ICD-10-CM

## 2021-01-09 MED ORDER — GOLYTELY 236 G PO SOLR: 4000.0000 mL | ORAL | 0 refills | Status: DC

## 2021-01-09 NOTE — Progress Notes (Signed)
No egg or soy allergy known to patient  No issues with past sedation with any surgeries or procedures Patient denies ever being told they had issues or difficulty with intubation  No FH of Malignant Hyperthermia No diet pills per patient No home 02 use per patient  No blood thinners per patient  Pt denies issues with constipation  No A fib or A flutter  EMMI video via MyChart  COVID 19 guidelines implemented in PV today with Pt and RN   NO PA's for preps discussed with pt in PV today  Discussed with pt there will be an out-of-pocket cost for prep and that varies from $0 to 70 dollars   Due to the COVID-19 pandemic we are asking patients to follow certain guidelines.  Pt aware of COVID protocols and [facility] guidelines   

## 2021-01-16 ENCOUNTER — Encounter: Payer: Self-pay | Admitting: Gastroenterology

## 2021-01-21 ENCOUNTER — Ambulatory Visit (AMBULATORY_SURGERY_CENTER): Payer: BC Managed Care – PPO | Admitting: Gastroenterology

## 2021-01-21 ENCOUNTER — Encounter: Payer: Self-pay | Admitting: Gastroenterology

## 2021-01-21 ENCOUNTER — Other Ambulatory Visit: Payer: Self-pay

## 2021-01-21 VITALS — BP 141/88 | HR 57 | Temp 97.3°F | Resp 14 | Ht 68.0 in | Wt 227.0 lb

## 2021-01-21 DIAGNOSIS — Z1211 Encounter for screening for malignant neoplasm of colon: Secondary | ICD-10-CM | POA: Diagnosis not present

## 2021-01-21 MED ORDER — SODIUM CHLORIDE 0.9 % IV SOLN: 500.0000 mL | Freq: Once | INTRAVENOUS | Status: DC

## 2021-01-21 NOTE — Patient Instructions (Signed)
Please read handouts provided. Continue present medications. High Fiber Diet.    YOU HAD AN ENDOSCOPIC PROCEDURE TODAY AT THE [facility]:   Refer to the procedure report that was given to you for any specific questions about what was found during the examination.  If the procedure report does not answer your questions, please call your gastroenterologist to clarify.  If you requested that your care partner not be given the details of your procedure findings, then the procedure report has been included in a sealed envelope for you to review at your convenience later.  YOU SHOULD EXPECT: Some feelings of bloating in the abdomen. Passage of more gas than usual.  Walking can help get rid of the air that was put into your GI tract during the procedure and reduce the bloating. If you had a lower endoscopy (such as a colonoscopy or flexible sigmoidoscopy) you may notice spotting of blood in your stool or on the toilet paper. If you underwent a bowel prep for your procedure, you may not have a normal bowel movement for a few days.  Please Note:  You might notice some irritation and congestion in your nose or some drainage.  This is from the oxygen used during your procedure.  There is no need for concern and it should clear up in a day or so.  SYMPTOMS TO REPORT IMMEDIATELY:   Following lower endoscopy (colonoscopy or flexible sigmoidoscopy):  Excessive amounts of blood in the stool  Significant tenderness or worsening of abdominal pains  Swelling of the abdomen that is new, acute  Fever of 100F or higher    For urgent or emergent issues, a gastroenterologist can be reached at any hour by calling [phone]. Do not use MyChart messaging for urgent concerns.    DIET:  We do recommend a small meal at first, but then you may proceed to your regular diet.  Drink plenty of fluids but you should avoid alcoholic beverages for 24 hours.  ACTIVITY:  You should plan to take it easy for  the rest of today and you should NOT DRIVE or use heavy machinery until tomorrow (because of the sedation medicines used during the test).    FOLLOW UP: Our staff will call the number listed on your records 48-72 hours following your procedure to check on you and address any questions or concerns that you may have regarding the information given to you following your procedure. If we do not reach you, we will leave a message.  We will attempt to reach you two times.  During this call, we will ask if you have developed any symptoms of COVID 19. If you develop any symptoms (ie: fever, flu-like symptoms, shortness of breath, cough etc.) before then, please call [phone].  If you test positive for Covid 19 in the 2 weeks post procedure, please call and report this information to us.    If any biopsies were taken you will be contacted by phone or by letter within the next 1-3 weeks.  Please call us at [phone] if you have not heard about the biopsies in 3 weeks.    SIGNATURES/CONFIDENTIALITY: You and/or your care partner have signed paperwork which will be entered into your electronic medical record.  These signatures attest to the fact that that the information above on your After Visit Summary has been reviewed and is understood.  Full responsibility of the confidentiality of this discharge information lies with you and/or your care-partner. 

## 2021-01-21 NOTE — Progress Notes (Signed)
Pt. Reports no change in his medical or surgical history since his pre-visit 01/09/2021.

## 2021-01-21 NOTE — Op Note (Signed)
[facility] Patient Name: Jonathan Farley Procedure Date: 01/21/2021 3:00 PM MRN: [id number] Endoscopist: Meryl Dare , MD Age: 45 Referring MD:  Date of Birth: 1976/08/10 Gender: Male Account #: [id number] Procedure:                Colonoscopy Indications:              Screening for colorectal malignant neoplasm Medicines:                Monitored Anesthesia Care Procedure:                Pre-Anesthesia Assessment:                           - Prior to the procedure, a History and Physical                            was performed, and patient medications and                            allergies were reviewed. The patient's tolerance of                            previous anesthesia was also reviewed. The risks                            and benefits of the procedure and the sedation                            options and risks were discussed with the patient.                            All questions were answered, and informed consent                            was obtained. Prior Anticoagulants: The patient has                            taken no previous anticoagulant or antiplatelet                            agents. ASA Grade Assessment: II - A patient with                            mild systemic disease. After reviewing the risks                            and benefits, the patient was deemed in                            satisfactory condition to undergo the procedure.                           After obtaining informed consent, the colonoscope  was passed under direct vision. Throughout the                            procedure, the patient's blood pressure, pulse, and                            oxygen saturations were monitored continuously. The                            Olympus CF-HQ190 [id number]) Colonoscope was                            introduced through the anus and advanced to the the                            cecum, identified  by appendiceal orifice and                            ileocecal valve. The ileocecal valve, appendiceal                            orifice, and rectum were photographed. The quality                            of the bowel preparation was good. The colonoscopy                            was performed without difficulty. The patient                            tolerated the procedure well. Scope In: 3:08:25 PM Scope Out: 3:21:20 PM Scope Withdrawal Time: 0 hours 11 minutes 3 seconds  Total Procedure Duration: 0 hours 12 minutes 55 seconds  Findings:                 The perianal and digital rectal examinations were                            normal.                           A few small-mouthed diverticula were found in the                            left colon. There was no evidence of diverticular                            bleeding.                           The exam was otherwise without abnormality on                            direct and retroflexion views. Complications:            No immediate complications. Estimated blood  loss:                            None. Estimated Blood Loss:     Estimated blood loss: none. Impression:               - Mild diverticulosis in the left colon.                           - The examination was otherwise normal on direct                            and retroflexion views.                           - No specimens collected. Recommendation:           - Repeat colonoscopy in 10 years for screening                            purposes.                           - Patient has a contact number available for                            emergencies. The signs and symptoms of potential                            delayed complications were discussed with the                            patient. Return to normal activities tomorrow.                            Written discharge instructions were provided to the                            patient.                            - High fiber diet.                           - Continue present medications. Meryl Dare, MD 01/21/2021 3:24:01 PM This report has been signed electronically.

## 2021-01-21 NOTE — Progress Notes (Signed)
A and O x3. Report to RN. Tolerated MAC anesthesia well.

## 2021-01-23 ENCOUNTER — Telehealth: Payer: Self-pay | Admitting: *Deleted

## 2021-01-23 NOTE — Telephone Encounter (Signed)
  Follow up Call-  Call back number 01/21/2021  Post procedure Call Back phone  # [phone]  Permission to leave phone message Yes  Some recent data might be hidden     Patient questions:  Do you have a fever, pain , or abdominal swelling? No. Pain Score  0 *  Have you tolerated food without any problems? Yes.    Have you been able to return to your normal activities? Yes.    Do you have any questions about your discharge instructions: Diet   No. Medications  No. Follow up visit  No.  Do you have questions or concerns about your Care? No.  Actions: * If pain score is 4 or above: No action needed, pain <4.  1. Have you developed a fever since your procedure? no  2.   Have you had an respiratory symptoms (SOB or cough) since your procedure? no  3.   Have you tested positive for COVID 19 since your procedure no  4.   Have you had any family members/close contacts diagnosed with the COVID 19 since your procedure? no   If yes to any of these questions please route to Laverna Peace, RN and Karlton Lemon, RN

## 2021-02-11 DIAGNOSIS — S9031XA Contusion of right foot, initial encounter: Secondary | ICD-10-CM | POA: Diagnosis not present

## 2021-03-13 DIAGNOSIS — K5792 Diverticulitis of intestine, part unspecified, without perforation or abscess without bleeding: Secondary | ICD-10-CM | POA: Diagnosis not present

## 2021-03-13 DIAGNOSIS — R1032 Left lower quadrant pain: Secondary | ICD-10-CM | POA: Diagnosis not present

## 2021-05-02 DIAGNOSIS — L237 Allergic contact dermatitis due to plants, except food: Secondary | ICD-10-CM | POA: Diagnosis not present

## 2021-11-10 DIAGNOSIS — Z125 Encounter for screening for malignant neoplasm of prostate: Secondary | ICD-10-CM | POA: Diagnosis not present

## 2021-11-10 DIAGNOSIS — R7989 Other specified abnormal findings of blood chemistry: Secondary | ICD-10-CM | POA: Diagnosis not present

## 2021-11-10 DIAGNOSIS — Z0000 Encounter for general adult medical examination without abnormal findings: Secondary | ICD-10-CM | POA: Diagnosis not present

## 2021-11-10 DIAGNOSIS — R7301 Impaired fasting glucose: Secondary | ICD-10-CM | POA: Diagnosis not present

## 2021-11-17 DIAGNOSIS — R7301 Impaired fasting glucose: Secondary | ICD-10-CM | POA: Diagnosis not present

## 2021-11-17 DIAGNOSIS — Z0000 Encounter for general adult medical examination without abnormal findings: Secondary | ICD-10-CM | POA: Diagnosis not present

## 2021-11-17 DIAGNOSIS — Z1339 Encounter for screening examination for other mental health and behavioral disorders: Secondary | ICD-10-CM | POA: Diagnosis not present

## 2021-11-17 DIAGNOSIS — Z1331 Encounter for screening for depression: Secondary | ICD-10-CM | POA: Diagnosis not present

## 2021-11-17 DIAGNOSIS — R82998 Other abnormal findings in urine: Secondary | ICD-10-CM | POA: Diagnosis not present

## 2021-11-18 DIAGNOSIS — S86892A Other injury of other muscle(s) and tendon(s) at lower leg level, left leg, initial encounter: Secondary | ICD-10-CM | POA: Diagnosis not present

## 2021-12-23 DIAGNOSIS — L309 Dermatitis, unspecified: Secondary | ICD-10-CM | POA: Diagnosis not present

## 2021-12-23 DIAGNOSIS — L814 Other melanin hyperpigmentation: Secondary | ICD-10-CM | POA: Diagnosis not present

## 2021-12-23 DIAGNOSIS — L578 Other skin changes due to chronic exposure to nonionizing radiation: Secondary | ICD-10-CM | POA: Diagnosis not present

## 2021-12-23 DIAGNOSIS — L918 Other hypertrophic disorders of the skin: Secondary | ICD-10-CM | POA: Diagnosis not present

## 2022-12-28 ENCOUNTER — Other Ambulatory Visit: Payer: Self-pay | Admitting: Internal Medicine

## 2022-12-28 DIAGNOSIS — E785 Hyperlipidemia, unspecified: Secondary | ICD-10-CM

## 2023-01-22 ENCOUNTER — Encounter: Payer: Self-pay | Admitting: Physician Assistant

## 2023-01-22 ENCOUNTER — Ambulatory Visit (INDEPENDENT_AMBULATORY_CARE_PROVIDER_SITE_OTHER): Payer: BC Managed Care – PPO | Admitting: Physician Assistant

## 2023-01-22 ENCOUNTER — Other Ambulatory Visit (INDEPENDENT_AMBULATORY_CARE_PROVIDER_SITE_OTHER): Payer: BC Managed Care – PPO

## 2023-01-22 DIAGNOSIS — M25561 Pain in right knee: Secondary | ICD-10-CM | POA: Diagnosis not present

## 2023-01-22 NOTE — Progress Notes (Signed)
Office Visit Note   Patient: Jonathan Farley           Date of Birth: 04-Apr-1976           MRN: [id number] Visit Date: 01/22/2023              Requested by: Jonathan Farley., MD [address] [place],  [place] [address] PCP: Jonathan Farley., MD   Assessment & Plan: Visit Diagnoses:  1. Acute pain of right knee     Plan: Jonathan Farley is a pleasant active 47 year old gentleman with a history of right knee instability and per his report completely torn ACL 7 years ago.  This was diagnosed by MRI and he and Jonathan Farley had a discussion.  He opted for conservative treatment as he is quite muscular and it was felt that he could regain stability in his knee through conservative means.  He is overall done well over the years he used a [employer] brace when he needed to he enjoys golfing and tennis though he plays pickle ball now.  He has not had much trouble equation occasionally some feelings of shifting.  Last night he said he planted his foot leg and twisted his knee.  He said he had more pain than usual.  He placed a hinged brace that he had over his knee.  Does not feel particularly unstable today's examination was fairly Gallant benign he does have a little bit more play with anterior drawer on the right than the left however not significant.  He has no tenderness over the joint lines and he has no effusion.  He is going on a golf trip in a couple months.  I recommended observation for the next couple weeks.  He should use the brace especially on uneven ground as needed.  Would defer him from playing golf if it seems more painful.  If he does not get better he will contact me in a couple weeks we will get a new MRI and he can follow-up with Jonathan Farley.  Rates his pain today is minimal  Follow-Up Instructions: No follow-ups on file.   Orders:  Orders Placed This Encounter  Procedures   XR KNEE 3 VIEW RIGHT   No orders of the defined types were placed in this encounter.     Procedures: No  procedures performed   Clinical Data: No additional findings.   Subjective: Chief Complaint  Patient presents with   Right Knee - Pain    HPI pleasant 47 year old gentleman with a history of a ACL deficient knee no meniscal tears by his report.  This was occurred in 2017.  He has had small incidence with his knee but recently had a shift in his knee last night.  He has enough pain that he was concerned and placed his leg in a knee brace.  Comes in today as he has a golfing trip in a couple weeks and enjoys activities and wants to be sure he is not doing further injury  Review of Systems  All other systems reviewed and are negative.    Objective: Vital Signs: There were no vitals taken for this visit.  Physical Exam Constitutional:      Appearance: Normal appearance.  Pulmonary:     Effort: Pulmonary effort is normal.  Skin:    General: Skin is warm and dry.  Neurological:     General: No focal deficit present.     Mental Status: He is alert.  Ortho Exam Examination of his right knee no effusion no redness no erythema he has good quadricep musculature.  He has good except sustained extension.  He is neurovascular intact compartments are soft and nontender no tenderness over the joint lines.  On anterior draw has a little bit more play certainly his quadricep musculature is helpful with this.  Does not have significant change from the left side.  Could be his baseline.  He is neurovascularly intact Specialty Comments:  No specialty comments available.  Imaging: No results found.   PMFS History: Patient Active Problem List   Diagnosis Date Noted   Pain in right knee 01/22/2023   Traumatic tear of supraspinatus tendon, right, initial encounter 11/09/2018   Past Medical History:  Diagnosis Date   ADHD    Sleep apnea    does not use CPAP    History reviewed. No pertinent family history.  Past Surgical History:  Procedure Laterality Date   SHOULDER ARTHROSCOPY  WITH ROTATOR CUFF REPAIR AND SUBACROMIAL DECOMPRESSION Right 11/09/2018   Procedure: RIGHT SHOULDER ARTHROSCOPY WITH EXTENSIVE DEBRIDEMENT, ROTATOR CUFF REPAIR AND SUBACROMIAL DECOMPRESSION;  Surgeon: Tarry Kos, MD;  Location: [facility];  Service: Orthopedics;  Laterality: Right;   WISDOM TOOTH EXTRACTION     Social History   Occupational History   Not on file  Tobacco Use   Smoking status: Never   Smokeless tobacco: Never  Vaping Use   Vaping Use: Never used  Substance and Sexual Activity   Alcohol use: Yes   Drug use: Never   Sexual activity: Not on file
